# Patient Record
Sex: Female | Born: 1980 | Race: White | Hispanic: No | Marital: Married | Smoking: Former smoker
Health system: Southern US, Community
[De-identification: ages and names within clinical notes are randomized; demographics above are authoritative.]

## PROBLEM LIST (undated history)

## (undated) DIAGNOSIS — E785 Hyperlipidemia, unspecified: Secondary | ICD-10-CM

## (undated) DIAGNOSIS — M26629 Arthralgia of temporomandibular joint, unspecified side: Secondary | ICD-10-CM

## (undated) HISTORY — DX: Arthralgia of temporomandibular joint, unspecified side: M26.629

## (undated) HISTORY — PX: MANDIBLE SURGERY: SHX707

## (undated) HISTORY — PX: RHINOPLASTY: SUR1284

## (undated) HISTORY — DX: Hyperlipidemia, unspecified: E78.5

---

## 2002-10-10 ENCOUNTER — Ambulatory Visit: Admission: RE | Admit: 2002-10-10 | Discharge: 2002-10-10 | Payer: Self-pay | Admitting: *Deleted

## 2003-05-15 ENCOUNTER — Inpatient Hospital Stay (HOSPITAL_COMMUNITY): Admission: RE | Admit: 2003-05-15 | Discharge: 2003-05-17 | Payer: Self-pay | Admitting: Dentistry

## 2003-11-05 ENCOUNTER — Other Ambulatory Visit: Admission: RE | Admit: 2003-11-05 | Discharge: 2003-11-05 | Payer: Self-pay | Admitting: Obstetrics and Gynecology

## 2004-03-25 ENCOUNTER — Other Ambulatory Visit: Admission: RE | Admit: 2004-03-25 | Discharge: 2004-03-25 | Payer: Self-pay | Admitting: Obstetrics and Gynecology

## 2004-08-26 ENCOUNTER — Other Ambulatory Visit: Admission: RE | Admit: 2004-08-26 | Discharge: 2004-08-26 | Payer: Self-pay | Admitting: Obstetrics and Gynecology

## 2005-03-12 ENCOUNTER — Other Ambulatory Visit: Admission: RE | Admit: 2005-03-12 | Discharge: 2005-03-12 | Payer: Self-pay | Admitting: Obstetrics and Gynecology

## 2005-09-09 ENCOUNTER — Other Ambulatory Visit: Admission: RE | Admit: 2005-09-09 | Discharge: 2005-09-09 | Payer: Self-pay | Admitting: Obstetrics and Gynecology

## 2005-10-25 HISTORY — PX: GYNECOLOGIC CRYOSURGERY: SHX857

## 2007-08-07 ENCOUNTER — Inpatient Hospital Stay (HOSPITAL_COMMUNITY): Admission: AD | Admit: 2007-08-07 | Discharge: 2007-08-10 | Payer: Self-pay | Admitting: Obstetrics and Gynecology

## 2010-10-06 ENCOUNTER — Ambulatory Visit: Payer: Self-pay | Admitting: Obstetrics & Gynecology

## 2010-11-14 ENCOUNTER — Encounter: Payer: Self-pay | Admitting: Dentistry

## 2011-03-09 NOTE — Assessment & Plan Note (Signed)
NAME:  Shannon Moses, Shannon Moses NO.:  [id number]   MEDICAL RECORD NO.:  [id number]          PATIENT TYPE:  POB   LOCATION:  [facility] at [place]         FACILITY:  [facility]   PHYSICIAN:  Allie Bossier, MD        DATE OF BIRTH:  1981-09-07   DATE OF SERVICE:  10/06/2010                                  CLINIC NOTE   Shannon Moses is a 30 year old married white, G1, P1, she has a 3-year-  old daughter.  She has previously been a patient at [facility], specifically Dr. Vincente Poli.  However, this location is much more  convenient for her.  She has no particular GYN complaints today.  She  would like a monthly refill of her Tri-Sprintec.   PAST MEDICAL HISTORY:  History of TMJ.   PAST SURGICAL HISTORY:  She has had a rhinoplasty as a child and then  reconstructive surgery of her jaw.  She had cryosurgery of her cervix in  approximately 2007.   REVIEW OF SYSTEMS:  She has been married for last 6 years.  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  She has monthly periods that last 5 days or less and are very light.   FAMILY HISTORY:  Negative for breast, GYN and colon malignancies.  No  latex allergies.  No drug allergies.   SOCIAL HISTORY:  She reports social alcohol and she will smoke  occasionally on a Friday or Saturday night.  A pack of cigarettes will  last for about 3 weeks.   PHYSICAL EXAMINATION:  GENERAL:  Well-nourished, well-hydrated pleasant  white female, in no apparent distress.  VITAL SIGNS:  Height 5 feet 9 inches, weight 141 pounds, blood pressure  126/74, pulse 65.  HEENT:  Normal.  HEART:  Regular rate and rhythm.  LUNGS:  Clear to auscultation bilaterally.  BREASTS:  Normal bilaterally.  ABDOMEN:  Scaphoid.  No palpable hepatosplenomegaly.  EXTERNAL GENITALIA:  No lesions.  Cervix parous with no visible lesions.  Uterus normal size and shape, retroverted, nontender and mobile.  Adnexa  nontender.  No  masses.   ASSESSMENT AND PLAN:  1. Annual exam.  I have checked Pap smear.  Recommended self-breast      and self-vulvar exams monthly.  2. Birth control.  She is happy with birth control pills and does not      wish to have a different form of birth control.  I have refilled      her Tri-Sprintec with 13 refills.      Allie Bossier, MD     MCD/MEDQ  D:  10/06/2010  T:  10/07/2010  Job:  [id number]

## 2011-03-12 NOTE — Op Note (Signed)
NAME:  Shannon Moses, Shannon Moses                     ACCOUNT NO.:  [id number]   MEDICAL RECORD NO.:  [id number]                   PATIENT TYPE:  AMB   LOCATION:  DAY                                  FACILITY:  [facility]   PHYSICIAN:  Griffith Citron Mohorn, D.D.S.          DATE OF BIRTH:  Apr 16, 1981   DATE OF PROCEDURE:  05/15/2003  DATE OF DISCHARGE:                                 OPERATIVE REPORT   PREOPERATIVE DIAGNOSES:  1. Vertical maxillary excess.  2. Mandibular retrognathia.  3. Retrogenia.  4. Class II malocclusion.   POSTOPERATIVE DIAGNOSES:  1. Vertical maxillary excess.  2. Mandibular retrognathism.  3. Retrogenia.  4. Class II malocclusion.   OPERATION/PROCEDURE:  1. LeFort I maxillary osteotomy impaction.  2. Bilateral sagittal split ramus osteotomy advancement.  3. Genioplasty advancement.   SURGEON:  Griffith Citron Mohorn, D.D.S.   CO-SURGEON:  Lovena Le, D.D.S.   ANESTHESIA:  General anesthesia.   ESTIMATED BLOOD LOSS:  400 mL.   INDICATIONS FOR PROCEDURE:  The patient was evaluated clinically and  radiographically and was found to have a Class II malocclusion due to  vertical maxillary excess as well as mandibular deficiency.  Also following  evaluation, the patient was found to still have a deficiency following the  planned surgery; therefore, she elected to have advancement genioplasty as  well.  The risks and benefits were discussed extensively with the patient  preoperatively.   DESCRIPTION OF PROCEDURE:  The patient was brought to the operating room and  placed in the supine position.  Once general anesthesia was induced,  following nasal endotracheal intubation, the patient was prepped and draped  for a maxillofacial procedure of this type.   Initially proximally 3 mL of 0.5% Marcaine with 1:200,000 epinephrine was  injected into the anterior mandibular vestibule.  Once adequate time for  hemostasis to be achieved, the incision was made extending  from the left  first premolar, approximately 5 mm from the attached gingival tissue down to  the bone extending anteriorly only to the mucosa, approximately 1 cm from  the wet/dry line of the lip,and continuing around to the right first  mandibular premolar and extending down to the mucosa through the periosteum  and into the bone.  This mucosal flap was then carefully undermined with a  15 blade, continuing deep to the mentalis muscle, directing the incision  towards the anterior cortex of the mandible and continued down through the  periosteum to the bone with care being taken to avoid any branches of the  mental nerve bilaterally.  Full-thickness mucoperiosteal flaps were  reflected inferiorly and posteriorly, exposing the mental foramens  bilaterally.  The dissection continued to the inferior border below and  posterior to the mental foramens bilaterally.  The anterior portion of the  dissection continued to until approximately 5 mm of tissue was still  attached to the inferior border.  Midline and paramidline vertical lines  were placed with a  fissure bur with copious amounts of saline irrigation.   With the mental foramen protected on the right side, the reciprocating saw  was utilized to create a bicortical cut through the chin extending to a  point in the midline and continuing across the midline to a similar point  below and posterior to the mental foramen on the left side.  Once this cut  was completed bilaterally, mental nerves were noted to be intact.  The chin  was mobilized and advanced approximately 6 mm.  Care was taken to evenly  advance the chin using the vertical reference lines placed prior to  osteotomy.  The chin was then stabilized with a 6 mm advancement utilizing a  six-hole bone plate manufacturing by Leibinger and utilizing 8 mm screws.  Following placement of the bone plate, the chin was found to be sufficiently  stable and symmetrically advanced.  A  saline-soaked throat pack was then  placed for hemostasis.   Attention was then directed towards initiation of the mandibular osteotomy.  Approximately 3 mL of 0.5% Marcaine was injected in both the right and left  posterior mandibular regions.  Next, starting on the left side, incision was  made at the inferior portion of the ascending ramus through the mucosa down  through periosteum and extending anteriorly to an area lateral to left  mandibular vomer first molar.  Next, a subperiosteal dissection was  continued to expose the inferior border, a portion of a left lateral cortex,  and extending superiorly along the coronoid process.  A ramus stripper was  utilized to fully retract the soft tissue off the anterior border of the  ramus, and the tissue retained with a self-retaining clip.  Careful  dissection crossed the medial cortex and mandible in order to remain  subperiosteal and, therefore, protect the inferior alveolar nerve was  completed.  Dissection continued posteriorly and superiorly to the intra-  alveolar foramen and the medial soft tissue was protected with a Henahan  retractor above the foramen.  Foramen was identified with a nerve hook and  marked with a elevator on the anterior ramus.  A long Lindeman bur was  utilized to create a medial horizontal cut above the inferior alveolar  foramen, again protecting the soft tissue with the Henahan retractor.  Copious amounts of saline irrigation was utilized.  This medial cut  continued to approximately one-half the medial lateral thickness of the  anterior ramus, again extending posteriorly to the inferior alveolar  foramen.  Next, a 701 bur was utilized to continue osteotomy inferiorly  along the ascending ramus and then continued lateral to the area of the  second molar which was not present at this time.  Next, a 703 bur was  utilized to create a vertical cut extending to the inferior border superiorly and connecting the  initial osteotomy previously described.  All  cuts were made through the cortex.  However, no deeper.  No excessive  bleeding was noted.   Attention was then directed towards the side and a similar soft tissue  reflection and osteotomy was created no the right side.  Again no excessive  bleeding was noted during any portion of this procedure either.  Moist half-  packs were placed on both the right and left mandibular surgical sites.   Attention was then directed towards the maxillary osteotomy.  Approximately  4 mL of 0.5% Marcaine with 1:200,000 epinephrine was injected in the  maxillary vestibule.  Utilizing electrocautery bovie, the mucosa was incised  approximately 5 mm superior to the attached gingival tissue extending from  the first premolar on the right to the first premolar on the left maxilla.  This incision continued down through the muscle and periosteum.  A  periosteal elevator was utilized to create a subperiosteal dissection  superiorly, exposing the infraorbital bilaterally.  The piriform rims were  also exposed bilaterally.  Dissection continued posteriorly to expose the  lateral wall of the maxilla to the pterygomaxillary junction.  This was  completed successfully bilaterally without complications.  Vertical  reference folds were placed near the piriform rims and zygomatic buttress  bilaterally.  The nasal mucosa was then dissected away from the piriform  rims and the nasal mucosa was reflected away from the floor of the nasal  cavity, carefully bilaterally.  A reciprocating saw was utilized to create  the osteotomy starting in the left zygomatic buttress region and continuing  anteriorly to the piriform rims, care being taken to keep the osteotomy at  least 5 mm above the apex of the canine teeth bilaterally.  Osteotomy was  continued through the anterior portion of the lateral wall of the nasal  cavity.  Once this osteotomy was completed satisfactorily bilaterally,  the  osteotomize were then continued posteriorly utilizing the reciprocating saw  through was lateral wall of the maxilla.  Once this osteotomy was completed  satisfactorily bilaterally, attention was then directed towards the  osteotomy of the lateral nasal walls more posteriorly.  A single guarded  nasal osteotome was utilized to separate the lateral nasal walls posteriorly  approximately 2 cm bilaterally.  The pterygomaxillary osteotome was then  placed in the right pterygomaxillary junction to complete this osteotomy.  This was also utilized to separate the maxilla from the pterygoid plates on  the left side as well. Again, no excessive bleeding was noted at this time.  Next, a nasoseptal osteotome was then utilized to separate the base of the  septum from the floor of the nasal cavity.  Next, with inferiorly and  posteriorly directed force, the maxilla was then down-fractured without  complications.  The nasal mucosa was also carefully reflected as the down- fracture was completed.  No excessive bleeding was noted.  Bony interference  were removed.  The maxilla was completely immobilized for the planned  movement of the maxilla.  Careful removal of bone was also completed to  allow for a 6 mm impaction equally on the maxilla.  The intermediate splint  was then wired and the patient was placed into intermaxillary fixation  utilized 26-gauge wires to connect the maxillary and mandibular braces.  The  maxillomandibular complexes then rotated and no interferences were noted.  More bony removal was required to allow for the full 6 mm impaction in all  areas of the maxilla.  Once this was completed satisfactorily.  Bone plates  were then placed to stabilize the maxilla in its new position.  Utilizing  Leibinger five-hole, L-shaped, 1.7 mm plates, the plates were contoured to  fit passively adjacent to the piriform rims bilaterally as well as the  zygomatic buttress bilaterally.  The bone  plates were then stabilized  utilizing 4 mm bone screws in a locking fashion with the plates.  Once all  four plates were stabilized with five screws in each plate, maxilla was  evaluated and found to be exceptionally stable.  The patient was released  from intermaxillary fixation and the new position of the maxilla was  evaluated and found to be as planned  with excellent bony contact in all  areas.  Stability of the maxilla was also again evaluated and found to be  excellent.   Attention was then directed towards completing the mandibular osteotomies.  Starting on the left side, a small bone osteotome was utilized to initiate  widening of the osteotomy with care being taken not to drive the osteotome  too deep to violate the inferior alveolar nerve.  Sequentially enlarging the  osteotomes used, the osteotomy was gradually widened and separated.  Then as  the osteotomy was gradually widened, care was taken to identify the inferior  alveolar nerve.  Once it was identified, it was gradually and carefully  dissected from the proximal segment with a Woodson without complications.  The osteotomy was completed and the proximal and distal segments were  mobilized without complications.  Again the inferior alveolar nerve was  found to be intact with no damage noted.  Bony interferences were removed  carefully.  Site was irrigated with normal saline and a half pack was  placed.   Attention was then directed towards the right side.  Similarly osteotomes  were utilized to gradually widen the osteotomy started previously.  Once the  osteotomy was separated superficially to identify the inferior alveolar  nerve, it was again gradually dissected from a proximal segment until  completely out of the proximal segment.  The osteotomy was completed and the  proximal and distal segments were found to be completely mobile.  Care was  taken to remove any bony interferences of the proximal segment to allow  for passive approximation of the proximal and distal segments bilaterally.   Attention was eventually directed towards stabilization of the right side.  The patient was placed into the final splint and placed into maxillary  fixation.  At this point again the right proximal segment was guided into  the planned position against the distal segment and was found to fit  passively with good  bony contact.  With posterior and superior direction of  the proximal segment, it was stabilized with a modified Wolford clamp.  Next, utilizing a trocar, 10 mm bicortical 2.0 screws manufactured by  Leibinger were placed at the superior border, stabilizing the proximal  segment to the distal segment of the mandible.  The Wolford clamp was then  removed and the segments were found to be extremely stable with no mobility.   Next, attention was directed towards the left side.  Again, the proximal and  distal segments fit passively after removal of bony fremitus and again was  deep.  Again, with posterior gentle posterior and superior direction of the  proximal segment to fully seat the condyle, the Wolford clamp was utilized  to stabilize the segments.  Again, Leibinger 2 mm bone screws were placed  bicortically to stabilize the proximal segment to the distal segment.  At  this point the [facility] clamp was removed and again the segments were found  to be extremely stable.  Intermaxillary fixation was released and the  patient's occlusion was evaluated.  She was found to have excellent  occlusion with a midline within the mandible matching the midline of the  maxilla.  She also had the planned occlusive contact of posterior teeth.  At  this point it was decided that the splint would not be placed due to the  amount of tooth contact she has.  The oral cavity was then irrigated and  suctioned dry.  All surgical sites were also irrigated thoroughly and  suctioned  dry.   Attention with then directed to towards  closure of the surgical sites.  It  should be noted that prior to stabilized the maxilla, the nasal mucosa was  repaired with 4-0 chromic sutures.  The nasal suture was then placed and  stabilizing the paranasal muscular tissue to the anterior nasal spine  through a small hole placed in the anterior nasal spine.  This was sutured  with a 2-0 Vicryl suture.  Next, a V-Y closure of the upper lip was then  performed with a 4-0 Vicryl suture.  The remaining horizontal incision of  the maxilla surgical site was then closed utilizing a 4-0 Vicryl suture in a  running fashion.  Next, the right posterior mandibular surgical site was  then closed utilizing a 4-0 Vicryl suture.  A similar 4-0 Vicryl suture was  osteomized to close the left mandibular site, and 4-0 Vicryl sutures were  then utilized to close and reapproximate the mentalis muscle.  The anterior  surgical site and the mucosa was also closed a 4-0 Vicryl suture.  This  completed the procedure.  The patient's oral cavity was suctioned dry.  The oropharyngeal throat pack  which been placed preoperatively was removed.  The oropharynx was also  suctioned dry.  There was no excessive bleeding noted anywhere.  The patient  was then placed into heavy elastics to provide stability of the mandible and  comfort in the initial postoperative phase connecting the maxillary and the  mandibular braces.  This essentially concluded the procedure.  The patient  was allowed to awaken in the OR and transferred to the PACU in stable and  satisfactory condition.                                               Griffith Citron Mohorn, D.D.S.    Gardenia Phlegm  D:  05/15/2003  T:  05/15/2003  Job:  [id number]

## 2011-08-04 LAB — CBC
HCT: 26.3 — ABNORMAL LOW
Hemoglobin: 9.2 — ABNORMAL LOW
MCHC: 35
MCV: 96.3
Platelets: 196
RBC: 2.73 — ABNORMAL LOW
RDW: 14.3 — ABNORMAL HIGH
WBC: 15.6 — ABNORMAL HIGH

## 2011-08-05 LAB — RAPID HIV SCREEN (WH-MAU): Rapid HIV Screen: NONREACTIVE

## 2011-08-05 LAB — CBC
HCT: 33 — ABNORMAL LOW
Hemoglobin: 11.8 — ABNORMAL LOW
MCHC: 35.6
MCV: 94.8
Platelets: 210
RBC: 3.48 — ABNORMAL LOW
RDW: 13.9
WBC: 12.4 — ABNORMAL HIGH

## 2011-08-05 LAB — RPR: RPR Ser Ql: NONREACTIVE

## 2011-11-10 ENCOUNTER — Other Ambulatory Visit: Payer: Self-pay | Admitting: Obstetrics & Gynecology

## 2011-11-30 ENCOUNTER — Encounter: Payer: Self-pay | Admitting: Obstetrics & Gynecology

## 2011-11-30 ENCOUNTER — Ambulatory Visit (INDEPENDENT_AMBULATORY_CARE_PROVIDER_SITE_OTHER): Payer: 59 | Admitting: Obstetrics & Gynecology

## 2011-11-30 VITALS — BP 108/64 | HR 48 | Temp 97.9°F | Ht 69.0 in | Wt 149.1 lb

## 2011-11-30 DIAGNOSIS — Z0000 Encounter for general adult medical examination without abnormal findings: Secondary | ICD-10-CM

## 2011-11-30 DIAGNOSIS — Z113 Encounter for screening for infections with a predominantly sexual mode of transmission: Secondary | ICD-10-CM

## 2011-11-30 DIAGNOSIS — Z1272 Encounter for screening for malignant neoplasm of vagina: Secondary | ICD-10-CM

## 2011-11-30 MED ORDER — NORGESTIM-ETH ESTRAD TRIPHASIC 0.18/0.215/0.25 MG-35 MCG PO TABS: 1.0000 | ORAL_TABLET | Freq: Every day | ORAL | Status: DC

## 2011-11-30 NOTE — Progress Notes (Signed)
Subjective:    Shannon Moses is a 31 y.o. female who presents for an annual exam. The patient has no complaints today. She would like a refill on her Tri-sprintec. She thinks she and her husband are finished with childbearing.The patient is sexually active. GYN screening history: last pap: was normal. The patient wears seatbelts: yes. The patient participates in regular exercise: yes. Has the patient ever been transfused or tattooed?: no. The patient reports that there is not domestic violence in her life.   Menstrual History: OB History    Grav Para Term Preterm Abortions TAB SAB Ect Mult Living   1 1              Menarche age:  Patient's last menstrual period was 11/10/2011.    The following portions of the patient's history were reviewed and updated as appropriate: allergies, current medications, past family history, past medical history, past social history, past surgical history and problem list.  Review of Systems A comprehensive review of systems was negative. She has had her flu shot. Her daughter is 9 yo. She teaches pre school.   Objective:    BP 108/64  Pulse 48  Temp(Src) 97.9 F (36.6 C) (Oral)  Ht 5\' 9"  (1.753 m)  Wt 149 lb 1.3 oz (67.622 kg)  BMI 22.02 kg/m2  LMP 11/10/2011  General Appearance:    Alert, cooperative, no distress, appears stated age  Head:    Normocephalic, without obvious abnormality, atraumatic  Eyes:    PERRL, conjunctiva/corneas clear, EOM's intact, fundi    benign, both eyes  Ears:    Normal TM's and external ear canals, both ears  Nose:   Nares normal, septum midline, mucosa normal, no drainage    or sinus tenderness  Throat:   Lips, mucosa, and tongue normal; teeth and gums normal  Neck:   Supple, symmetrical, trachea midline, no adenopathy;    thyroid:  no enlargement/tenderness/nodules; no carotid   bruit or JVD  Back:     Symmetric, no curvature, ROM normal, no CVA tenderness  Lungs:     Clear to auscultation bilaterally,  respirations unlabored  Chest Wall:    No tenderness or deformity   Heart:    Regular rate and rhythm, S1 and S2 normal, no murmur, rub   or gallop  Breast Exam:    No tenderness, masses, or nipple abnormality  Abdomen:     Soft, non-tender, bowel sounds active all four quadrants,    no masses, no organomegaly  Genitalia:    Normal female without lesion, discharge or tenderness, NSSA, NT, no adnexal masses     Extremities:   Extremities normal, atraumatic, no cyanosis or edema  Pulses:   2+ and symmetric all extremities  Skin:   Skin color, texture, turgor normal, no rashes or lesions  Lymph nodes:   Cervical, supraclavicular, and axillary nodes normal  Neurologic:   CNII-XII intact, normal strength, sensation and reflexes    throughout  .    Assessment:    Healthy female exam.    Plan:     Pap smear.  She is not interested in LARC, wants a refill on OCPs.

## 2012-01-03 ENCOUNTER — Other Ambulatory Visit: Payer: Self-pay | Admitting: Obstetrics & Gynecology

## 2012-10-23 HISTORY — PX: BUNIONECTOMY: SHX129

## 2012-11-30 ENCOUNTER — Ambulatory Visit (INDEPENDENT_AMBULATORY_CARE_PROVIDER_SITE_OTHER): Payer: 59 | Admitting: Obstetrics & Gynecology

## 2012-11-30 ENCOUNTER — Encounter: Payer: Self-pay | Admitting: Obstetrics & Gynecology

## 2012-11-30 VITALS — BP 121/82 | HR 64 | Resp 16 | Ht 69.0 in | Wt 156.0 lb

## 2012-11-30 DIAGNOSIS — Z01419 Encounter for gynecological examination (general) (routine) without abnormal findings: Secondary | ICD-10-CM

## 2012-11-30 DIAGNOSIS — Z0000 Encounter for general adult medical examination without abnormal findings: Secondary | ICD-10-CM

## 2012-11-30 DIAGNOSIS — Z1151 Encounter for screening for human papillomavirus (HPV): Secondary | ICD-10-CM

## 2012-11-30 DIAGNOSIS — Z124 Encounter for screening for malignant neoplasm of cervix: Secondary | ICD-10-CM

## 2012-11-30 MED ORDER — NORGESTIM-ETH ESTRAD TRIPHASIC 0.18/0.215/0.25 MG-35 MCG PO TABS: 1.0000 | ORAL_TABLET | Freq: Every day | ORAL | Status: DC

## 2012-11-30 NOTE — Progress Notes (Signed)
Subjective:    Shannon Moses is a 32 y.o. female who presents for an annual exam. The patient has no complaints today.she would like a refill of her OCPs. The patient is sexually active. GYN screening history: last pap: was normal. The patient wears seatbelts: yes. The patient participates in regular exercise: yes. Has the patient ever been transfused or tattooed?: not asked. The patient reports that there is not domestic violence in her life.   Menstrual History: OB History    Grav Para Term Preterm Abortions TAB SAB Ect Mult Living   1 1              Menarche age: 70 Patient's last menstrual period was 11/07/2012.    The following portions of the patient's history were reviewed and updated as appropriate: allergies, current medications, past family history, past medical history, past social history, past surgical history and problem list.  Review of Systems A comprehensive review of systems was negative. She has been married for about 7 years, is a Manufacturing systems engineer. 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   Objective:    BP 121/82  Pulse 64  Resp 16  Ht 5\' 9"  (1.753 m)  Wt 156 lb (70.761 kg)  BMI 23.04 kg/m2  LMP 11/07/2012  General Appearance:    Alert, cooperative, no distress, appears stated age  Head:    Normocephalic, without obvious abnormality, atraumatic  Eyes:    PERRL, conjunctiva/corneas clear, EOM's intact, fundi    benign, both eyes  Ears:    Normal TM's and external ear canals, both ears  Nose:   Nares normal, septum midline, mucosa normal, no drainage    or sinus tenderness  Throat:   Lips, mucosa, and tongue normal; teeth and gums normal  Neck:   Supple, symmetrical, trachea midline, no adenopathy;    thyroid:  no enlargement/tenderness/nodules; no carotid   bruit or JVD  Back:     Symmetric, no curvature, ROM normal, no CVA tenderness  Lungs:     Clear to auscultation bilaterally, respirations unlabored  Chest Wall:    No tenderness or deformity   Heart:    Regular rate and rhythm, S1 and S2 normal, no murmur, rub   or gallop  Breast Exam:    No tenderness, masses, or nipple abnormality  Abdomen:     Soft, non-tender, bowel sounds active all four quadrants,    no masses, no organomegaly  Genitalia:    Normal female without lesion, discharge or tenderness, NSSA, NT, mobile, normal adnexal exam     Extremities:   Extremities normal, atraumatic, no cyanosis or edema  Pulses:   2+ and symmetric all extremities  Skin:   Skin color, texture, turgor normal, no rashes or lesions  Lymph nodes:   Cervical, supraclavicular, and axillary nodes normal  Neurologic:   CNII-XII intact, normal strength, sensation and reflexes    throughout  .    Assessment:    Healthy female exam.    Plan:     Thin prep Pap smear.  with HPV cotesting

## 2012-12-04 ENCOUNTER — Other Ambulatory Visit: Payer: Self-pay | Admitting: Obstetrics & Gynecology

## 2013-01-31 ENCOUNTER — Other Ambulatory Visit: Payer: Self-pay | Admitting: Obstetrics & Gynecology

## 2013-02-23 ENCOUNTER — Other Ambulatory Visit: Payer: Self-pay | Admitting: *Deleted

## 2013-02-23 DIAGNOSIS — IMO0001 Reserved for inherently not codable concepts without codable children: Secondary | ICD-10-CM

## 2013-02-23 MED ORDER — NORGESTIM-ETH ESTRAD TRIPHASIC 0.18/0.215/0.25 MG-35 MCG PO TABS: 1.0000 | ORAL_TABLET | Freq: Every day | ORAL | Status: DC

## 2013-02-23 NOTE — Telephone Encounter (Signed)
Pharmacy called verifying pt's RF's on OCP which should have been original RX with 11 RF's.

## 2013-12-05 ENCOUNTER — Ambulatory Visit: Payer: 59 | Admitting: Obstetrics & Gynecology

## 2013-12-12 ENCOUNTER — Ambulatory Visit: Payer: 59 | Admitting: Obstetrics & Gynecology

## 2013-12-18 ENCOUNTER — Ambulatory Visit: Payer: 59 | Admitting: Obstetrics & Gynecology

## 2013-12-18 ENCOUNTER — Telehealth: Payer: Self-pay | Admitting: *Deleted

## 2013-12-18 NOTE — Telephone Encounter (Signed)
Lm on voicemail that due to inclement weather the office will be closed today and pt needs to call office to reschedule.

## 2014-01-01 ENCOUNTER — Ambulatory Visit: Payer: 59 | Admitting: Obstetrics & Gynecology

## 2014-01-10 ENCOUNTER — Encounter: Payer: Self-pay | Admitting: Obstetrics & Gynecology

## 2014-01-10 ENCOUNTER — Ambulatory Visit (INDEPENDENT_AMBULATORY_CARE_PROVIDER_SITE_OTHER): Payer: 59 | Admitting: Obstetrics & Gynecology

## 2014-01-10 VITALS — BP 138/83 | HR 62 | Resp 16 | Ht 69.0 in | Wt 147.0 lb

## 2014-01-10 DIAGNOSIS — Z124 Encounter for screening for malignant neoplasm of cervix: Secondary | ICD-10-CM

## 2014-01-10 DIAGNOSIS — Z1151 Encounter for screening for human papillomavirus (HPV): Secondary | ICD-10-CM

## 2014-01-10 DIAGNOSIS — Z3041 Encounter for surveillance of contraceptive pills: Secondary | ICD-10-CM

## 2014-01-10 DIAGNOSIS — Z01419 Encounter for gynecological examination (general) (routine) without abnormal findings: Secondary | ICD-10-CM

## 2014-01-10 DIAGNOSIS — IMO0001 Reserved for inherently not codable concepts without codable children: Secondary | ICD-10-CM

## 2014-01-10 DIAGNOSIS — Z0000 Encounter for general adult medical examination without abnormal findings: Secondary | ICD-10-CM

## 2014-01-10 MED ORDER — NORGESTIM-ETH ESTRAD TRIPHASIC 0.18/0.215/0.25 MG-35 MCG PO TABS: 1.0000 | ORAL_TABLET | Freq: Every day | ORAL | Status: DC

## 2014-01-10 NOTE — Progress Notes (Signed)
Subjective:    Shannon Moses is a 33 y.o. female who presents for an annual exam. The patient has no complaints today.She wants a refill of her OCPs.  The patient is sexually active. GYN screening history: last pap: was normal. The patient wears seatbelts: yes. The patient participates in regular exercise: yes. Has the patient ever been transfused or tattooed?: no. The patient reports that there is not domestic violence in her life.   Menstrual History: OB History   Grav Para Term Preterm Abortions TAB SAB Ect Mult Living   1 1              Menarche age: [age]  Patient's last menstrual period was 01/01/2014.    The following portions of the patient's history were reviewed and updated as appropriate: allergies, current medications, past family history, past medical history, past social history, past surgical history and problem list.  Review of Systems A comprehensive review of systems was negative. Married for 9 1/2 years, denies dyspareunia. She had a flu vaccine this season. Teaches preschool.   Objective:    BP 138/83  Pulse 62  Resp 16  Ht 5\' 9"  (1.753 m)  Wt 147 lb (66.679 kg)  BMI 21.70 kg/m2  LMP 01/01/2014  General Appearance:    Alert, cooperative, no distress, appears stated age  Head:    Normocephalic, without obvious abnormality, atraumatic  Eyes:    PERRL, conjunctiva/corneas clear, EOM's intact, fundi    benign, both eyes  Ears:    Normal TM's and external ear canals, both ears  Nose:   Nares normal, septum midline, mucosa normal, no drainage    or sinus tenderness  Throat:   Lips, mucosa, and tongue normal; teeth and gums normal  Neck:   Supple, symmetrical, trachea midline, no adenopathy;    thyroid:  no enlargement/tenderness/nodules; no carotid   bruit or JVD  Back:     Symmetric, no curvature, ROM normal, no CVA tenderness  Lungs:     Clear to auscultation bilaterally, respirations unlabored  Chest Wall:    No tenderness or deformity   Heart:    Regular  rate and rhythm, S1 and S2 normal, no murmur, rub   or gallop  Breast Exam:    No tenderness, masses, or nipple abnormality  Abdomen:     Soft, non-tender, bowel sounds active all four quadrants,    no masses, no organomegaly  Genitalia:    Normal female without lesion, discharge or tenderness, NSSR, NT, mobile, normal adnexal exam     Extremities:   Extremities normal, atraumatic, no cyanosis or edema  Pulses:   2+ and symmetric all extremities  Skin:   Skin color, texture, turgor normal, no rashes or lesions  Lymph nodes:   Cervical, supraclavicular, and axillary nodes normal  Neurologic:   CNII-XII intact, normal strength, sensation and reflexes    throughout  .    Assessment:    Healthy female exam.    Plan:     Breast self exam technique reviewed and patient encouraged to perform self-exam monthly. Thin prep Pap smear.  I have refilled her OCPs

## 2014-01-23 ENCOUNTER — Other Ambulatory Visit: Payer: Self-pay | Admitting: Obstetrics & Gynecology

## 2014-07-25 ENCOUNTER — Other Ambulatory Visit (INDEPENDENT_AMBULATORY_CARE_PROVIDER_SITE_OTHER): Payer: 59 | Admitting: *Deleted

## 2014-07-25 DIAGNOSIS — N898 Other specified noninflammatory disorders of vagina: Secondary | ICD-10-CM

## 2014-07-25 DIAGNOSIS — N9489 Other specified conditions associated with female genital organs and menstrual cycle: Secondary | ICD-10-CM

## 2014-07-25 MED ORDER — METRONIDAZOLE 500 MG PO TABS: 500.0000 mg | ORAL_TABLET | Freq: Two times a day (BID) | ORAL | Status: DC

## 2014-07-25 NOTE — Progress Notes (Signed)
Pt has no vaginal discharge, itching, burning. The only Sx she has is the odor. Pt is going out of town on vacation. I explained the risks of alcohol use while taking Flagyl. Pt expressed understanding and will follow up when she returns from vacation.

## 2014-07-25 NOTE — Progress Notes (Signed)
Pt here c/o vaginal odor that started last night. Pt showered but odor returned.  Discharge Itching Burning

## 2014-07-26 ENCOUNTER — Telehealth: Payer: Self-pay | Admitting: *Deleted

## 2014-07-26 LAB — WET PREP BY MOLECULAR PROBE
CANDIDA SPECIES: NEGATIVE
GARDNERELLA VAGINALIS: POSITIVE — AB
TRICHOMONAS VAG: NEGATIVE

## 2014-07-26 NOTE — Telephone Encounter (Signed)
Called pt to adv positive BV and needs to take medication that was called into pharmacy yesterday. LMOM for pt to rtn call if needed.

## 2014-08-05 ENCOUNTER — Telehealth: Payer: Self-pay | Admitting: *Deleted

## 2014-08-05 NOTE — Telephone Encounter (Signed)
Pt called in stating she started Flagyl for BV. After 4 days on Flagyl pt stated she "felt abd cramping and felt something come out of vagina and a tampon came out while using the bathroom". Pt states the vaginal odor went away and no longer had cramps. She finished Flagyl and has no adverse symptoms. I explained to pt that as long as she finished Flagyl and the tampon is out she should be good but if she starts to have Sx again then to come in for exam. Pt expressed understanding.

## 2014-08-26 ENCOUNTER — Encounter: Payer: Self-pay | Admitting: Obstetrics & Gynecology

## 2015-03-17 ENCOUNTER — Ambulatory Visit (INDEPENDENT_AMBULATORY_CARE_PROVIDER_SITE_OTHER): Payer: Managed Care, Other (non HMO) | Admitting: Obstetrics & Gynecology

## 2015-03-17 ENCOUNTER — Encounter: Payer: Self-pay | Admitting: Obstetrics & Gynecology

## 2015-03-17 VITALS — BP 115/73 | HR 68 | Resp 16 | Ht 69.0 in | Wt 148.0 lb

## 2015-03-17 DIAGNOSIS — Z01419 Encounter for gynecological examination (general) (routine) without abnormal findings: Secondary | ICD-10-CM

## 2015-03-17 DIAGNOSIS — Z124 Encounter for screening for malignant neoplasm of cervix: Secondary | ICD-10-CM | POA: Diagnosis not present

## 2015-03-17 DIAGNOSIS — Z1151 Encounter for screening for human papillomavirus (HPV): Secondary | ICD-10-CM | POA: Diagnosis not present

## 2015-03-17 DIAGNOSIS — Z0000 Encounter for general adult medical examination without abnormal findings: Secondary | ICD-10-CM

## 2015-03-17 MED ORDER — NORGESTIM-ETH ESTRAD TRIPHASIC 0.18/0.215/0.25 MG-35 MCG PO TABS: 1.0000 | ORAL_TABLET | Freq: Every day | ORAL | Status: DC

## 2015-03-17 NOTE — Progress Notes (Signed)
Subjective:    Shannon ReddenJennifer D Moses is a 34 y.o. MW 48P1 (737 yo daughter) female who presents for an annual exam. The patient has no complaints today. The patient is sexually active. GYN screening history: last pap: was normal. The patient wears seatbelts: yes. The patient participates in regular exercise: yes. Has the patient ever been transfused or tattooed?: no. The patient reports that there is not domestic violence in her life.   Menstrual History: OB History    Gravida Para Term Preterm AB TAB SAB Ectopic Multiple Living   1 1              Menarche age: 34  Patient's last menstrual period was 02/24/2015.    The following portions of the patient's history were reviewed and updated as appropriate: allergies, current medications, past family history, past medical history, past social history, past surgical history and problem list.  Review of Systems Pertinent items are noted in HPI.  Married for 10 years, denies dyspareunia. Preschool teacher, now Interior and spatial designerdirector. Happy with the pill but wants husband to get vasectomy.  Objective:    BP 115/73 mmHg  Pulse 68  Resp 16  Ht 5\' 9"  (1.753 m)  Wt 148 lb (67.132 kg)  BMI 21.85 kg/m2  LMP 02/24/2015  General Appearance:    Alert, cooperative, no distress, appears stated age  Head:    Normocephalic, without obvious abnormality, atraumatic  Eyes:    PERRL, conjunctiva/corneas clear, EOM's intact, fundi    benign, both eyes  Ears:    Normal TM's and external ear canals, both ears  Nose:   Nares normal, septum midline, mucosa normal, no drainage    or sinus tenderness  Throat:   Lips, mucosa, and tongue normal; teeth and gums normal  Neck:   Supple, symmetrical, trachea midline, no adenopathy;    thyroid:  no enlargement/tenderness/nodules; no carotid   bruit or JVD  Back:     Symmetric, no curvature, ROM normal, no CVA tenderness  Lungs:     Clear to auscultation bilaterally, respirations unlabored  Chest Wall:    No tenderness or deformity   Heart:    Regular rate and rhythm, S1 and S2 normal, no murmur, rub   or gallop  Breast Exam:    No tenderness, masses, or nipple abnormality  Abdomen:     Soft, non-tender, bowel sounds active all four quadrants,    no masses, no organomegaly  Genitalia:    Normal female without lesion, discharge or tenderness, NSSA, NT, mobile, normal adnexal exam     Extremities:   Extremities normal, atraumatic, no cyanosis or edema  Pulses:   2+ and symmetric all extremities  Skin:   Skin color, texture, turgor normal, no rashes or lesions  Lymph nodes:   Cervical, supraclavicular, and axillary nodes normal  Neurologic:   CNII-XII intact, normal strength, sensation and reflexes    throughout  .    Assessment:    Healthy female exam.    Plan:     Breast self exam technique reviewed and patient encouraged to perform self-exam monthly. Thin prep Pap smear. with cotesting

## 2015-03-19 LAB — CYTOLOGY - PAP

## 2015-03-20 ENCOUNTER — Other Ambulatory Visit: Payer: Self-pay | Admitting: Obstetrics & Gynecology

## 2015-03-28 ENCOUNTER — Other Ambulatory Visit: Payer: Managed Care, Other (non HMO)

## 2015-03-28 LAB — COMPREHENSIVE METABOLIC PANEL
ALBUMIN: 4.1 g/dL (ref 3.5–5.2)
ALT: 14 U/L (ref 0–35)
AST: 14 U/L (ref 0–37)
Alkaline Phosphatase: 36 U/L — ABNORMAL LOW (ref 39–117)
BUN: 14 mg/dL (ref 6–23)
CALCIUM: 9.1 mg/dL (ref 8.4–10.5)
CHLORIDE: 106 meq/L (ref 96–112)
CO2: 27 meq/L (ref 19–32)
Creat: 0.67 mg/dL (ref 0.50–1.10)
Glucose, Bld: 82 mg/dL (ref 70–99)
POTASSIUM: 4.8 meq/L (ref 3.5–5.3)
Sodium: 140 mEq/L (ref 135–145)
Total Bilirubin: 0.5 mg/dL (ref 0.2–1.2)
Total Protein: 6.7 g/dL (ref 6.0–8.3)

## 2015-03-28 LAB — LIPID PANEL
CHOLESTEROL: 203 mg/dL — AB (ref 0–200)
HDL: 81 mg/dL (ref >=46)
LDL Cholesterol: 103 mg/dL — ABNORMAL HIGH (ref 0–99)
Total CHOL/HDL Ratio: 2.5 Ratio
Triglycerides: 97 mg/dL (ref <150)
VLDL: 19 mg/dL (ref 0–40)

## 2015-03-28 LAB — CBC
HCT: 38.9 % (ref 36.0–46.0)
Hemoglobin: 13.1 g/dL (ref 12.0–15.0)
MCH: 32.2 pg (ref 26.0–34.0)
MCHC: 33.7 g/dL (ref 30.0–36.0)
MCV: 95.6 fL (ref 78.0–100.0)
MPV: 9.5 fL (ref 8.6–12.4)
Platelets: 279 10*3/uL (ref 150–400)
RBC: 4.07 MIL/uL (ref 3.87–5.11)
RDW: 12.7 % (ref 11.5–15.5)
WBC: 7.6 10*3/uL (ref 4.0–10.5)

## 2015-03-28 LAB — TSH: TSH: 1.315 u[IU]/mL (ref 0.350–4.500)

## 2015-04-01 LAB — VITAMIN D 1,25 DIHYDROXY
Vitamin D 1, 25 (OH)2 Total: 60 pg/mL (ref 18–72)
Vitamin D2 1, 25 (OH)2: <8 pg/mL
Vitamin D3 1, 25 (OH)2: 60 pg/mL

## 2016-03-30 ENCOUNTER — Encounter: Payer: Self-pay | Admitting: Obstetrics & Gynecology

## 2016-03-30 ENCOUNTER — Ambulatory Visit (INDEPENDENT_AMBULATORY_CARE_PROVIDER_SITE_OTHER): Payer: Managed Care, Other (non HMO) | Admitting: Obstetrics & Gynecology

## 2016-03-30 VITALS — BP 117/74 | HR 53 | Resp 16 | Ht 69.0 in | Wt 143.0 lb

## 2016-03-30 DIAGNOSIS — Z01419 Encounter for gynecological examination (general) (routine) without abnormal findings: Secondary | ICD-10-CM | POA: Diagnosis not present

## 2016-03-30 DIAGNOSIS — Z124 Encounter for screening for malignant neoplasm of cervix: Secondary | ICD-10-CM | POA: Diagnosis not present

## 2016-03-30 DIAGNOSIS — Z1151 Encounter for screening for human papillomavirus (HPV): Secondary | ICD-10-CM

## 2016-03-30 MED ORDER — NORGESTIM-ETH ESTRAD TRIPHASIC 0.18/0.215/0.25 MG-35 MCG PO TABS: 1.0000 | ORAL_TABLET | Freq: Every day | ORAL | Status: DC

## 2016-03-30 NOTE — Progress Notes (Signed)
Subjective:    Shannon ReddenJennifer D Moses is a 35 y.o. MW 41P1 (36 yo daughter) female who presents for an annual exam. The patient has no complaints today. She is happy with her OCPs, needs a refill. Hoping her husband will get a vasectomy.  The patient is sexually active. GYN screening history: last pap: was normal. The patient wears seatbelts: yes. The patient participates in regular exercise: yes. Has the patient ever been transfused or tattooed?: no. The patient reports that there is not domestic violence in her life.   Menstrual History: OB History    Gravida Para Term Preterm AB TAB SAB Ectopic Multiple Living   1 1              Menarche age: [age]  Patient's last menstrual period was 03/23/2016.    The following portions of the patient's history were reviewed and updated as appropriate: allergies, current medications, past family history, past medical history, past social history, past surgical history and problem list.  Review of Systems Pertinent items are noted in HPI. 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   Objective:    BP 117/74 mmHg  Pulse 53  Resp 16  Ht 5\' 9"  (1.753 m)  Wt 143 lb (64.864 kg)  BMI 21.11 kg/m2  LMP 03/23/2016  General Appearance:    Alert, cooperative, no distress, appears stated age  Head:    Normocephalic, without obvious abnormality, atraumatic  Eyes:    PERRL, conjunctiva/corneas clear, EOM's intact, fundi    benign, both eyes  Ears:    Normal TM's and external ear canals, both ears  Nose:   Nares normal, septum midline, mucosa normal, no drainage    or sinus tenderness  Throat:   Lips, mucosa, and tongue normal; teeth and gums normal  Neck:   Supple, symmetrical, trachea midline, no adenopathy;    thyroid:  no enlargement/tenderness/nodules; no carotid   bruit or JVD  Back:     Symmetric, no curvature, ROM normal, no CVA tenderness  Lungs:     Clear to auscultation bilaterally, respirations unlabored  Chest Wall:    No  tenderness or deformity   Heart:    Regular rate and rhythm, S1 and S2 normal, no murmur, rub   or gallop  Breast Exam:    No tenderness, masses, or nipple abnormality  Abdomen:     Soft, non-tender, bowel sounds active all four quadrants,    no masses, no organomegaly  Genitalia:    Normal female without lesion, discharge or tenderness, NSSR, NT, mobile, normal adnexal exam     Extremities:   Extremities normal, atraumatic, no cyanosis or edema  Pulses:   2+ and symmetric all extremities  Skin:   Skin color, texture, turgor normal, no rashes or lesions  Lymph nodes:   Cervical, supraclavicular, and axillary nodes normal  Neurologic:   CNII-XII intact, normal strength, sensation and reflexes    throughout  .    Assessment:    Healthy female exam.    Plan:     Thin prep Pap smear.  with cotesting (she is aware of ACOG recs)

## 2016-04-01 LAB — CYTOLOGY - PAP

## 2017-04-19 ENCOUNTER — Other Ambulatory Visit: Payer: Self-pay | Admitting: Obstetrics & Gynecology

## 2017-04-19 DIAGNOSIS — Z3041 Encounter for surveillance of contraceptive pills: Secondary | ICD-10-CM

## 2017-04-21 NOTE — Telephone Encounter (Signed)
Refill sent to pharmacy.   

## 2017-07-06 ENCOUNTER — Encounter: Payer: Self-pay | Admitting: Obstetrics & Gynecology

## 2017-07-06 ENCOUNTER — Ambulatory Visit (INDEPENDENT_AMBULATORY_CARE_PROVIDER_SITE_OTHER): Payer: 59 | Admitting: Obstetrics & Gynecology

## 2017-07-06 VITALS — BP 117/74 | HR 69 | Ht 69.0 in | Wt 144.0 lb

## 2017-07-06 DIAGNOSIS — Z113 Encounter for screening for infections with a predominantly sexual mode of transmission: Secondary | ICD-10-CM | POA: Diagnosis not present

## 2017-07-06 DIAGNOSIS — Z202 Contact with and (suspected) exposure to infections with a predominantly sexual mode of transmission: Secondary | ICD-10-CM | POA: Diagnosis not present

## 2017-07-06 DIAGNOSIS — Z3041 Encounter for surveillance of contraceptive pills: Secondary | ICD-10-CM

## 2017-07-06 DIAGNOSIS — Z01419 Encounter for gynecological examination (general) (routine) without abnormal findings: Secondary | ICD-10-CM | POA: Diagnosis not present

## 2017-07-06 MED ORDER — NORGESTIM-ETH ESTRAD TRIPHASIC 0.18/0.215/0.25 MG-35 MCG PO TABS: 1.0000 | ORAL_TABLET | Freq: Every day | ORAL | 3 refills | Status: DC

## 2017-07-06 NOTE — Progress Notes (Signed)
Subjective:     Shannon ReddenJennifer D Moses is a 36 y.o. female here for a routine exam.  Current complaints: separated from husband; had one boyfriend and unprotected intercourse.  Desires STD testing today.  Taking birth control with no issue.   Gynecologic History Patient's last menstrual period was 07/06/2017. Contraception: OCP (estrogen/progesterone) Last Pap: 2017. Results were: normal   Obstetric History OB History  Gravida Para Term Preterm AB Living  1 1       1   SAB TAB Ectopic Multiple Live Births               # Outcome Date GA Lbr Len/2nd Weight Sex Delivery Anes PTL Lv  1 Para                The following portions of the patient's history were reviewed and updated as appropriate: allergies, current medications, past family history, past medical history, past social history, past surgical history and problem list.  Review of Systems Pertinent items noted in HPI and remainder of comprehensive ROS otherwise negative.    Objective:      Vitals:   07/06/17 0940  BP: 117/74  Pulse: 69  Weight: 144 lb (65.3 kg)  Height: 5\' 9"  (1.753 m)   Vitals:  WNL General appearance: alert, cooperative and no distress  HEENT: Normocephalic, without obvious abnormality, atraumatic Eyes: negative Throat: lips, mucosa, and tongue normal; teeth and gums normal  Respiratory: Clear to auscultation bilaterally  CV: Regular rate and rhythm  Breasts:  Normal appearance, no masses or tenderness, no nipple retraction or dimpling  GI: Soft, non-tender; bowel sounds normal; no masses,  no organomegaly  GU: External Genitalia:  Tanner V, no lesion Urethra:  No prolapse   Vagina: Pink, normal rugae, no blood or discharge  Cervix: No CMT, no lesion  Uterus:  Normal size and contour, non tender  Adnexa: Normal, no masses, non tender  Musculoskeletal: No edema, redness or tenderness in the calves or thighs  Skin: No lesions or rash  Lymphatic: Axillary adenopathy: none     Psychiatric:  Normal mood and behavior        Assessment:    Healthy female exam.    Plan:   STD testing.  Pap not due for at least for 2 more years.  Educated patient about HPV and cervical cancer.   Continue OCPs.

## 2017-07-07 LAB — HEPATITIS B SURFACE ANTIGEN: Hepatitis B Surface Ag: NONREACTIVE

## 2017-07-07 LAB — CERVICOVAGINAL ANCILLARY ONLY
Chlamydia: NEGATIVE
NEISSERIA GONORRHEA: NEGATIVE
Trichomonas: NEGATIVE

## 2017-07-07 LAB — HIV ANTIBODY (ROUTINE TESTING W REFLEX): HIV: NONREACTIVE

## 2017-07-07 LAB — HEPATITIS C ANTIBODY
Hepatitis C Ab: NONREACTIVE
SIGNAL TO CUT-OFF: 0.04 (ref <1.00)

## 2017-07-07 LAB — RPR: RPR Ser Ql: NONREACTIVE

## 2017-07-11 ENCOUNTER — Telehealth: Payer: Self-pay | Admitting: *Deleted

## 2017-07-11 NOTE — Telephone Encounter (Signed)
-----   Message from Lesly Dukes, MD sent at 07/10/2017  6:59 AM EDT ----- Cultures are negative.  RN/CMA to call

## 2017-07-11 NOTE — Telephone Encounter (Signed)
Pt notified of neg labs,cultures and pap smear.

## 2017-11-09 ENCOUNTER — Other Ambulatory Visit (INDEPENDENT_AMBULATORY_CARE_PROVIDER_SITE_OTHER): Payer: 59

## 2017-11-09 DIAGNOSIS — R309 Painful micturition, unspecified: Secondary | ICD-10-CM | POA: Diagnosis not present

## 2017-11-09 LAB — POCT URINALYSIS DIPSTICK
BILIRUBIN UA: NEGATIVE
Glucose, UA: NEGATIVE
Ketones, UA: NEGATIVE
NITRITE UA: POSITIVE
PH UA: 7.5 (ref 5.0–8.0)
SPEC GRAV UA: 1.015 (ref 1.010–1.025)
UROBILINOGEN UA: NEGATIVE U/dL — AB

## 2017-11-09 MED ORDER — PHENAZOPYRIDINE HCL 200 MG PO TABS: 200.0000 mg | ORAL_TABLET | Freq: Three times a day (TID) | ORAL | 0 refills | Status: DC | PRN

## 2017-11-09 MED ORDER — SULFAMETHOXAZOLE-TRIMETHOPRIM 800-160 MG PO TABS: 1.0000 | ORAL_TABLET | Freq: Two times a day (BID) | ORAL | 0 refills | Status: DC

## 2017-11-09 NOTE — Progress Notes (Signed)
Pt here with c/o's urinary burning and pain with pressure.  Urinalysis shows Nitrites, Leukocytes, blood .  Culture sent and per protocol pt started on Bactrim DS and Pyridium.  This was sent to [facility].

## 2017-11-12 LAB — CULTURE, URINE COMPREHENSIVE
MICRO NUMBER: 90066251
SPECIMEN QUALITY:: ADEQUATE

## 2018-01-09 ENCOUNTER — Other Ambulatory Visit: Payer: Self-pay | Admitting: *Deleted

## 2018-01-09 DIAGNOSIS — Z3041 Encounter for surveillance of contraceptive pills: Secondary | ICD-10-CM

## 2018-01-09 MED ORDER — NORGESTIM-ETH ESTRAD TRIPHASIC 0.18/0.215/0.25 MG-35 MCG PO TABS: 1.0000 | ORAL_TABLET | Freq: Every day | ORAL | 3 refills | Status: DC

## 2018-01-13 ENCOUNTER — Encounter: Payer: Self-pay | Admitting: Obstetrics & Gynecology

## 2018-01-13 ENCOUNTER — Other Ambulatory Visit (INDEPENDENT_AMBULATORY_CARE_PROVIDER_SITE_OTHER): Payer: 59

## 2018-01-13 VITALS — Resp 16 | Ht 69.0 in | Wt 144.0 lb

## 2018-01-13 DIAGNOSIS — R319 Hematuria, unspecified: Secondary | ICD-10-CM | POA: Diagnosis not present

## 2018-01-13 DIAGNOSIS — N390 Urinary tract infection, site not specified: Secondary | ICD-10-CM | POA: Diagnosis not present

## 2018-01-13 LAB — POCT URINALYSIS DIPSTICK
BILIRUBIN UA: NEGATIVE
Glucose, UA: NEGATIVE
KETONES UA: NEGATIVE
Nitrite, UA: POSITIVE
PH UA: 6 (ref 5.0–8.0)
Protein, UA: NEGATIVE
Spec Grav, UA: 1.015 (ref 1.010–1.025)
UROBILINOGEN UA: NEGATIVE U/dL — AB

## 2018-01-13 MED ORDER — SULFAMETHOXAZOLE-TRIMETHOPRIM 400-80 MG PO TABS: 1.0000 | ORAL_TABLET | Freq: Two times a day (BID) | ORAL | 1 refills | Status: DC

## 2018-01-13 NOTE — Progress Notes (Signed)
Pt here with c/o's of urinary frequency and pain that woke her up @ 4 AM this morning.  She took a Pyridium and has gotten some relief.  Urine dip shows blood nitrites and leuks.  Urine culture sent to lab and RX for Bactrim DS sent to her pharmacy per protocol

## 2018-01-15 LAB — CULTURE, URINE COMPREHENSIVE
MICRO NUMBER: 90362919
SPECIMEN QUALITY:: ADEQUATE

## 2018-01-16 ENCOUNTER — Telehealth: Payer: Self-pay | Admitting: *Deleted

## 2018-01-16 MED ORDER — NITROFURANTOIN MONOHYD MACRO 100 MG PO CAPS: 100.0000 mg | ORAL_CAPSULE | Freq: Two times a day (BID) | ORAL | 0 refills | Status: DC

## 2018-01-16 MED ORDER — FLUCONAZOLE 150 MG PO TABS: 150.0000 mg | ORAL_TABLET | Freq: Once | ORAL | 1 refills | Status: AC

## 2018-01-16 NOTE — Telephone Encounter (Signed)
Pt notified that she needs Macrobid for her UTI.  Pt also request Diflucan since she is susceptible to yeast when taking ATX.  [facility] Marice Potterove.

## 2018-01-16 NOTE — Telephone Encounter (Signed)
-----   Message from Allie BossierMyra C Dove, MD sent at 01/16/2018  8:14 AM EDT ----- She needs macrobid for a UTI. Thanks

## 2018-02-08 ENCOUNTER — Ambulatory Visit (INDEPENDENT_AMBULATORY_CARE_PROVIDER_SITE_OTHER): Payer: 59 | Admitting: Obstetrics & Gynecology

## 2018-02-08 ENCOUNTER — Ambulatory Visit (INDEPENDENT_AMBULATORY_CARE_PROVIDER_SITE_OTHER): Payer: 59

## 2018-02-08 ENCOUNTER — Encounter: Payer: Self-pay | Admitting: Obstetrics & Gynecology

## 2018-02-08 ENCOUNTER — Other Ambulatory Visit: Payer: 59

## 2018-02-08 VITALS — BP 130/77 | HR 69 | Ht 69.0 in | Wt 147.0 lb

## 2018-02-08 DIAGNOSIS — R319 Hematuria, unspecified: Secondary | ICD-10-CM

## 2018-02-08 DIAGNOSIS — Z113 Encounter for screening for infections with a predominantly sexual mode of transmission: Secondary | ICD-10-CM

## 2018-02-08 DIAGNOSIS — Z3202 Encounter for pregnancy test, result negative: Secondary | ICD-10-CM | POA: Diagnosis not present

## 2018-02-08 DIAGNOSIS — N83202 Unspecified ovarian cyst, left side: Secondary | ICD-10-CM | POA: Diagnosis not present

## 2018-02-08 DIAGNOSIS — R102 Pelvic and perineal pain: Secondary | ICD-10-CM

## 2018-02-08 DIAGNOSIS — Z202 Contact with and (suspected) exposure to infections with a predominantly sexual mode of transmission: Secondary | ICD-10-CM

## 2018-02-08 DIAGNOSIS — Z01419 Encounter for gynecological examination (general) (routine) without abnormal findings: Secondary | ICD-10-CM | POA: Diagnosis not present

## 2018-02-08 LAB — POCT URINE PREGNANCY: Preg Test, Ur: NEGATIVE

## 2018-02-08 MED ORDER — IOPAMIDOL (ISOVUE-300) INJECTION 61%
100.0000 mL | Freq: Once | INTRAVENOUS | Status: AC | PRN
  Administered 2018-02-08: 100 mL via INTRAVENOUS

## 2018-02-08 MED ORDER — MEGESTROL ACETATE 40 MG PO TABS: ORAL_TABLET | ORAL | 0 refills | Status: DC

## 2018-02-08 NOTE — Progress Notes (Signed)
Pt c/o pelvic pain 

## 2018-02-08 NOTE — Addendum Note (Signed)
Addended by: Granville LewisLARK, Emlyn Maves L on: 02/08/2018 03:35 PM   Modules accepted: Orders

## 2018-02-08 NOTE — Progress Notes (Signed)
Subjective:     Shannon Moses is a 37 y.o. female here for a routine exam.  Current complaints: left sided pain for several weeks and recurrent UTIs (+culture in epic).  Pt in new relationship and talking about getting married and having a child.     Gynecologic History Patient's last menstrual period was 01/24/2018. Contraception: OCP (estrogen/progesterone) Last Pap: 2017. Results were: normal Last mammogram: n/a.   Obstetric History OB History  Gravida Para Term Preterm AB Living  1 1       1   SAB TAB Ectopic Multiple Live Births               # Outcome Date GA Lbr Len/2nd Weight Sex Delivery Anes PTL Lv  1 Para              The following portions of the patient's history were reviewed and updated as appropriate: allergies, current medications, past family history, past medical history, past social history, past surgical history and problem list.  Review of Systems Pertinent items noted in HPI and remainder of comprehensive ROS otherwise negative.    Objective:      Vitals:   02/08/18 1022  BP: 130/77  Pulse: 69  Weight: 147 lb (66.7 kg)  Height: 5\' 9"  (1.753 m)   Vitals:  WNL General appearance: alert, cooperative and no distress  HEENT: Normocephalic, without obvious abnormality, atraumatic Eyes: negative Throat: lips, mucosa, and tongue normal; teeth and gums normal  Respiratory: Clear to auscultation bilaterally  CV: Regular rate and rhythm  Breasts:  Normal appearance, no masses or tenderness, no nipple retraction or dimpling  GI: Soft, non-tender; bowel sounds normal; no masses,  no organomegaly  GU: External Genitalia:  Tanner V, no lesion Urethra:  No prolapse   Vagina: Pink, normal rugae, no blood or discharge  Cervix: No CMT, no lesion  Uterus:  Normal size and contour, non tender  Adnexa: Normal, no masses, mild pain in right adnexa  Musculoskeletal: No edema, redness or tenderness in the calves or thighs  Skin: No lesions or rash  Lymphatic:  Axillary adenopathy: none     Psychiatric: Normal mood and behavior        Assessment:    Healthy female exam.    Plan:   Pap due 2020 Clean catch UTI + culture Will get CT abd/pelvis with contrast to r/u GU abnormality for recurrent UTI STD panel Cont OCPs

## 2018-02-09 ENCOUNTER — Other Ambulatory Visit: Payer: Self-pay | Admitting: *Deleted

## 2018-02-09 DIAGNOSIS — N83209 Unspecified ovarian cyst, unspecified side: Secondary | ICD-10-CM

## 2018-02-09 DIAGNOSIS — N390 Urinary tract infection, site not specified: Secondary | ICD-10-CM

## 2018-02-09 LAB — COMPREHENSIVE METABOLIC PANEL
AG RATIO: 1.9 (calc) (ref 1.0–2.5)
ALKALINE PHOSPHATASE (APISO): 44 U/L (ref 33–115)
ALT: 11 U/L (ref 6–29)
AST: 14 U/L (ref 10–30)
Albumin: 4.5 g/dL (ref 3.6–5.1)
BUN: 9 mg/dL (ref 7–25)
CHLORIDE: 102 mmol/L (ref 98–110)
CO2: 28 mmol/L (ref 20–32)
Calcium: 9.5 mg/dL (ref 8.6–10.2)
Creat: 0.78 mg/dL (ref 0.50–1.10)
Globulin: 2.4 g/dL (calc) (ref 1.9–3.7)
Glucose, Bld: 85 mg/dL (ref 65–99)
Potassium: 4.3 mmol/L (ref 3.5–5.3)
Sodium: 137 mmol/L (ref 135–146)
Total Bilirubin: 0.5 mg/dL (ref 0.2–1.2)
Total Protein: 6.9 g/dL (ref 6.1–8.1)

## 2018-02-09 LAB — HEPATITIS C ANTIBODY
Hepatitis C Ab: NONREACTIVE
SIGNAL TO CUT-OFF: 0.04 (ref <1.00)

## 2018-02-09 LAB — RPR: RPR: NONREACTIVE

## 2018-02-09 LAB — CERVICOVAGINAL ANCILLARY ONLY
CHLAMYDIA, DNA PROBE: NEGATIVE
Neisseria Gonorrhea: NEGATIVE

## 2018-02-09 LAB — HEPATITIS B SURFACE ANTIGEN: Hepatitis B Surface Ag: NONREACTIVE

## 2018-02-09 LAB — HIV ANTIBODY (ROUTINE TESTING W REFLEX): HIV: NONREACTIVE

## 2018-02-09 NOTE — Progress Notes (Signed)
Pt aware of her CT results.  Order placed for F/U ovarian cyst in 6 weeks per Dr Penne LashLeggett.

## 2018-02-09 NOTE — Addendum Note (Signed)
Addended by: Kathie DikeSOLA, Essa Wenk J on: 02/09/2018 01:36 PM   Modules accepted: Orders

## 2018-03-21 ENCOUNTER — Ambulatory Visit (INDEPENDENT_AMBULATORY_CARE_PROVIDER_SITE_OTHER): Payer: 59

## 2018-03-21 DIAGNOSIS — N83202 Unspecified ovarian cyst, left side: Secondary | ICD-10-CM

## 2018-03-21 DIAGNOSIS — N83209 Unspecified ovarian cyst, unspecified side: Secondary | ICD-10-CM

## 2018-03-22 ENCOUNTER — Telehealth: Payer: Self-pay | Admitting: *Deleted

## 2018-03-22 NOTE — Telephone Encounter (Signed)
Pt notified of normal TVU.  Pt stated that she had seen the results on My-Chart last night.

## 2018-03-22 NOTE — Telephone Encounter (Signed)
-----   Message from Lesly Dukes, MD sent at 03/21/2018  9:45 PM EDT ----- Normal ultrasound.  No ovarian cysts.

## 2018-11-06 ENCOUNTER — Ambulatory Visit (INDEPENDENT_AMBULATORY_CARE_PROVIDER_SITE_OTHER): Payer: 59 | Admitting: Obstetrics & Gynecology

## 2018-11-06 ENCOUNTER — Encounter: Payer: Self-pay | Admitting: Obstetrics & Gynecology

## 2018-11-06 ENCOUNTER — Ambulatory Visit: Payer: 59 | Admitting: Obstetrics & Gynecology

## 2018-11-06 VITALS — BP 114/78 | HR 48 | Resp 16 | Ht 69.0 in | Wt 146.0 lb

## 2018-11-06 DIAGNOSIS — N898 Other specified noninflammatory disorders of vagina: Secondary | ICD-10-CM

## 2018-11-06 DIAGNOSIS — B9689 Other specified bacterial agents as the cause of diseases classified elsewhere: Secondary | ICD-10-CM | POA: Diagnosis not present

## 2018-11-06 DIAGNOSIS — N760 Acute vaginitis: Secondary | ICD-10-CM

## 2018-11-06 NOTE — Progress Notes (Signed)
   Subjective:    Patient ID: Shannon Moses, female    DOB: August 16, 1981, 38 y.o.   MRN: [id number]  HPI 38 yo married lady here with the concern of a bump in the vaginal introitus that she noticed recently.  She recently was treated with diflucan for a yeast infection. She stopped taking OCPs and is taking PNVs, wanting a pregnancy.   Review of Systems     Objective:   Physical Exam Breathing, conversing, and ambulating normally Well nourished, well hydrated White female, no apparent distress Vulva-shaved She points to a bit of a hymenal remnant when asked what concerns her. - reassurance given Vaginal discharge with speculum exam is prominent, white with yellowish tinge     Assessment & Plan:  Vaginal discharge- wet prep sent

## 2018-11-06 NOTE — Progress Notes (Signed)
Pt c/o vaginal irritation, odor and a lesion

## 2018-11-07 LAB — CERVICOVAGINAL ANCILLARY ONLY
Bacterial vaginitis: POSITIVE — AB
CANDIDA VAGINITIS: NEGATIVE

## 2018-11-09 DIAGNOSIS — N760 Acute vaginitis: Principal | ICD-10-CM

## 2018-11-09 DIAGNOSIS — B9689 Other specified bacterial agents as the cause of diseases classified elsewhere: Secondary | ICD-10-CM

## 2018-11-09 MED ORDER — METRONIDAZOLE 500 MG PO TABS: 500.0000 mg | ORAL_TABLET | Freq: Two times a day (BID) | ORAL | 0 refills | Status: DC

## 2018-11-09 NOTE — Telephone Encounter (Signed)
Sent message a pt on MyChart letting her know of positive BV results and Flagyl has been sent to her pharmacy.

## 2019-01-19 ENCOUNTER — Telehealth: Payer: Self-pay | Admitting: *Deleted

## 2019-01-19 MED ORDER — FLUCONAZOLE 150 MG PO TABS: 150.0000 mg | ORAL_TABLET | Freq: Once | ORAL | 0 refills | Status: AC

## 2019-01-19 MED ORDER — SULFAMETHOXAZOLE-TRIMETHOPRIM 800-160 MG PO TABS: 1.0000 | ORAL_TABLET | Freq: Two times a day (BID) | ORAL | 0 refills | Status: DC

## 2019-01-19 NOTE — Telephone Encounter (Signed)
Pt called stating that she was having pain and urgency when she goes to bathroom.  It started this AM.  She does have a h/o recurrent UTI's.  Due to the Co-Vid 19 stay in order she was RX Bactrim DS and [facility]  She will call if her SX do not improve.

## 2019-04-30 ENCOUNTER — Other Ambulatory Visit: Payer: Self-pay

## 2019-04-30 ENCOUNTER — Encounter: Payer: Self-pay | Admitting: *Deleted

## 2019-04-30 ENCOUNTER — Other Ambulatory Visit (INDEPENDENT_AMBULATORY_CARE_PROVIDER_SITE_OTHER): Payer: 59 | Admitting: *Deleted

## 2019-04-30 VITALS — Temp 97.8°F | Resp 16

## 2019-04-30 DIAGNOSIS — R309 Painful micturition, unspecified: Secondary | ICD-10-CM | POA: Diagnosis not present

## 2019-04-30 DIAGNOSIS — N898 Other specified noninflammatory disorders of vagina: Secondary | ICD-10-CM | POA: Diagnosis not present

## 2019-04-30 DIAGNOSIS — B9689 Other specified bacterial agents as the cause of diseases classified elsewhere: Secondary | ICD-10-CM | POA: Diagnosis not present

## 2019-04-30 DIAGNOSIS — N760 Acute vaginitis: Secondary | ICD-10-CM

## 2019-04-30 LAB — POCT URINALYSIS DIPSTICK
Appearance: NORMAL
Bilirubin, UA: NEGATIVE
Glucose, UA: NEGATIVE
Ketones, UA: NEGATIVE
Nitrite, UA: NEGATIVE
Protein, UA: NEGATIVE
Spec Grav, UA: <=1.005 — AB (ref 1.010–1.025)
Urobilinogen, UA: NEGATIVE E.U./dL — AB
pH, UA: 8.5 — AB (ref 5.0–8.0)

## 2019-04-30 MED ORDER — PHENAZOPYRIDINE HCL 200 MG PO TABS: 200.0000 mg | ORAL_TABLET | Freq: Three times a day (TID) | ORAL | 0 refills | Status: DC | PRN

## 2019-04-30 NOTE — Progress Notes (Signed)
SUBJECTIVE: Shannon Moses is a 38 y.o. female who complains of urinary frequency, urgency and dysuria x 1 days, without flank pain, fever, chills, or abnormal vaginal discharge.  She woke up this morning around 1:00 with urinary spasms and was feeling very uncomfortable.  She states that she had to go multiple times but only dribbled.  She does have a history of UTI's.    OBJECTIVE: Appears well, in no apparent distress.  Vital signs are normal. Urine dipstick shows  Large leukocytes but no nitrites.  She does have large blood but she did start her period today also.  I also had her to do a swab swab for BV and yeast just to r/o.  She has been spending a lot of time at the pool this summer. ASSESSMENT: Dysuria  PLAN: Treatment per orders.  Will send in RX for Pyridium and pt is aware that the urine culture will take 48 hours.   Call or return to clinic prn if these symptoms worsen or fail to improve as anticipated.

## 2019-05-01 ENCOUNTER — Telehealth: Payer: Self-pay | Admitting: *Deleted

## 2019-05-01 LAB — CERVICOVAGINAL ANCILLARY ONLY
Bacterial vaginitis: POSITIVE — AB
Candida vaginitis: NEGATIVE

## 2019-05-01 MED ORDER — METRONIDAZOLE 500 MG PO TABS: 500.0000 mg | ORAL_TABLET | Freq: Two times a day (BID) | ORAL | 0 refills | Status: DC

## 2019-05-01 NOTE — Telephone Encounter (Signed)
LM on voicemail that her culture was positive for BV and [facility].  Will notify as urine culture is resulted.

## 2019-05-02 ENCOUNTER — Other Ambulatory Visit: Payer: Self-pay | Admitting: Obstetrics & Gynecology

## 2019-05-02 LAB — CULTURE, URINE COMPREHENSIVE
MICRO NUMBER:: 637610
SPECIMEN QUALITY:: ADEQUATE

## 2019-05-02 MED ORDER — NITROFURANTOIN MONOHYD MACRO 100 MG PO CAPS: 100.0000 mg | ORAL_CAPSULE | Freq: Two times a day (BID) | ORAL | 0 refills | Status: DC

## 2019-05-02 NOTE — Progress Notes (Signed)
U culture return Staph Saprophyticus.  Rx with marcobid.  Message sent to RN staff.

## 2020-02-06 ENCOUNTER — Ambulatory Visit (INDEPENDENT_AMBULATORY_CARE_PROVIDER_SITE_OTHER): Payer: 59

## 2020-02-06 ENCOUNTER — Other Ambulatory Visit: Payer: Self-pay

## 2020-02-06 DIAGNOSIS — R300 Dysuria: Secondary | ICD-10-CM | POA: Diagnosis not present

## 2020-02-06 LAB — POCT URINALYSIS DIPSTICK
Bilirubin, UA: NEGATIVE
Glucose, UA: NEGATIVE
Ketones, UA: NEGATIVE
Leukocytes, UA: NEGATIVE
Nitrite, UA: NEGATIVE
Protein, UA: NEGATIVE
Spec Grav, UA: 1.01 (ref 1.010–1.025)
Urobilinogen, UA: 0.2 E.U./dL
pH, UA: 5 (ref 5.0–8.0)

## 2020-02-06 NOTE — Progress Notes (Signed)
Pt c/o UTI symptoms. Pt denies yeast or BV symptoms. Pt declines Aptima swab. Urine culture sent. Pt states her symptoms have improved today but, she just wants to make sure. Dipstick normal.

## 2020-02-08 LAB — URINE CULTURE
MICRO NUMBER:: 10362567
SPECIMEN QUALITY:: ADEQUATE

## 2020-02-10 ENCOUNTER — Telehealth: Payer: Self-pay | Admitting: Obstetrics and Gynecology

## 2020-02-10 NOTE — Telephone Encounter (Signed)
Discussed urinary results with Victorino Dike. + urine culture She had called the on-call RN yesterday d/t discomfort. She was given Rx for bactrim. She has taken 3 doses so far and is feeling much better. Reports 3-4 UTI's this year alone. Recommend she call the office Monday for f/u and would consider referral to urology.  Patient agreeable to plan of care.   Duane Lope, NP 02/10/2020 12:40 PM

## 2020-02-18 ENCOUNTER — Other Ambulatory Visit: Payer: Self-pay

## 2020-02-18 ENCOUNTER — Other Ambulatory Visit (HOSPITAL_COMMUNITY)
Admission: RE | Admit: 2020-02-18 | Discharge: 2020-02-18 | Disposition: A | Discharge status: Home / Self Care | Payer: 59 | Source: Ambulatory Visit | Attending: Obstetrics & Gynecology | Admitting: Obstetrics & Gynecology

## 2020-02-18 ENCOUNTER — Ambulatory Visit (INDEPENDENT_AMBULATORY_CARE_PROVIDER_SITE_OTHER): Payer: 59 | Admitting: Obstetrics & Gynecology

## 2020-02-18 ENCOUNTER — Encounter: Payer: Self-pay | Admitting: Obstetrics & Gynecology

## 2020-02-18 VITALS — BP 127/87 | HR 64 | Ht 69.0 in | Wt 155.0 lb

## 2020-02-18 DIAGNOSIS — Z1272 Encounter for screening for malignant neoplasm of vagina: Secondary | ICD-10-CM

## 2020-02-18 DIAGNOSIS — R102 Pelvic and perineal pain: Secondary | ICD-10-CM | POA: Diagnosis not present

## 2020-02-18 DIAGNOSIS — Z01419 Encounter for gynecological examination (general) (routine) without abnormal findings: Secondary | ICD-10-CM | POA: Diagnosis present

## 2020-02-18 DIAGNOSIS — Z3202 Encounter for pregnancy test, result negative: Secondary | ICD-10-CM | POA: Diagnosis not present

## 2020-02-18 DIAGNOSIS — N83201 Unspecified ovarian cyst, right side: Secondary | ICD-10-CM

## 2020-02-18 LAB — POCT URINE PREGNANCY: Preg Test, Ur: NEGATIVE

## 2020-02-18 NOTE — Progress Notes (Signed)
Pt c/o pain after intercourse

## 2020-02-19 ENCOUNTER — Ambulatory Visit (INDEPENDENT_AMBULATORY_CARE_PROVIDER_SITE_OTHER): Payer: 59

## 2020-02-19 ENCOUNTER — Other Ambulatory Visit: Payer: Self-pay | Admitting: Obstetrics & Gynecology

## 2020-02-19 ENCOUNTER — Ambulatory Visit: Payer: 59

## 2020-02-19 DIAGNOSIS — R102 Pelvic and perineal pain: Secondary | ICD-10-CM | POA: Diagnosis not present

## 2020-02-19 LAB — CYTOLOGY - PAP
Adequacy: ABSENT
Comment: NEGATIVE
Diagnosis: NEGATIVE
High risk HPV: NEGATIVE

## 2020-02-20 LAB — URINE CULTURE
MICRO NUMBER:: 10405908
Result:: NO GROWTH
SPECIMEN QUALITY:: ADEQUATE

## 2020-02-21 NOTE — Progress Notes (Signed)
   Subjective:    Patient ID: Shannon Moses, female    DOB: [birth year], 39 y.o.   MRN: [id number]   [name] is a 39 y.o. female here for a routine exam.  Current complaints: severe right lower quadrant pain after intercourse 5 days ago.  Pt had natural childbirth and this pain was worse.  She used a heating pad and OTC analgesics to manage.  Pain not present now and see is very worried it will return.  Pt also was recently treated for e coli ut and needs TOC.   Gynecologic History Patient's last menstrual period was 01/27/2020. Contraception: abstinence and rhythm method Last Pap: 2017. Results were: normal Last mammogram: none.   Obstetric History OB History  Gravida Para Term Preterm AB Living  1 1       1   SAB TAB Ectopic Multiple Live Births               # Outcome Date GA Lbr Len/2nd Weight Sex Delivery Anes PTL Lv  1 Para              The following portions of the patient's history were reviewed and updated as appropriate: allergies, current medications, past family history, past medical history, past social history, past surgical history and problem list.  Review of Systems Pertinent items noted in HPI and remainder of comprehensive ROS otherwise negative.    Objective:      Vitals:   02/18/20 1333  BP: 127/87  Pulse: 64  Weight: 155 lb (70.3 kg)  Height: 5\' 9"  (1.753 m)   Vitals:  WNL General appearance: alert, cooperative and no distress  HEENT: Normocephalic, without obvious abnormality, atraumatic Eyes: negative Throat: lips, mucosa, and tongue normal; teeth and gums normal  Respiratory: Clear to auscultation bilaterally  CV: Regular rate and rhythm  Breasts:  Normal appearance, no masses or tenderness, no nipple retraction or dimpling  GI: Soft, non-tender; bowel sounds normal; no masses,  no organomegaly  GU: External Genitalia:  Tanner V, no lesion Urethra:  No prolapse   Vagina: Pink, normal rugae, no blood or discharge  Cervix: No CMT,  no lesion  Uterus:  Normal size and contour, non tender  Adnexa: Normal, no masses, mild tenderness with deep palpation of right ovary  Musculoskeletal: No edema, redness or tenderness in the calves or thighs  Skin: No lesions or rash  Lymphatic: Axillary adenopathy: none     Psychiatric: Normal mood and behavior       Assessment:    Healthy female exam.    Plan:   1.  Pap with co-tesing 2.  TVUS to evaluate adnexa 3.  TOC from recent UTI.

## 2020-03-27 ENCOUNTER — Other Ambulatory Visit: Payer: 59

## 2020-04-02 ENCOUNTER — Ambulatory Visit (INDEPENDENT_AMBULATORY_CARE_PROVIDER_SITE_OTHER): Payer: 59

## 2020-04-02 ENCOUNTER — Other Ambulatory Visit: Payer: Self-pay

## 2020-04-02 DIAGNOSIS — N83201 Unspecified ovarian cyst, right side: Secondary | ICD-10-CM

## 2020-04-07 ENCOUNTER — Telehealth: Payer: Self-pay | Admitting: *Deleted

## 2020-04-07 NOTE — Telephone Encounter (Cosign Needed)
Pt called stating that for the past 2 months her cycles have been somewhat irregular coming earlier than expected.  She and her husband have decided that she will go back on OCP's.  She started her period on Sat and has some Tri-Previfem she had from before that are still in date and wants to go ahead and start them on Sunday.

## 2020-06-24 ENCOUNTER — Other Ambulatory Visit: Payer: Self-pay | Admitting: *Deleted

## 2020-06-24 MED ORDER — NORGESTIM-ETH ESTRAD TRIPHASIC 0.18/0.215/0.25 MG-35 MCG PO TABS: 1.0000 | ORAL_TABLET | Freq: Every day | ORAL | 8 refills | Status: DC

## 2020-06-24 NOTE — Telephone Encounter (Signed)
Pt called requesting a RF on Tri-Previfem be sent to [facility].  Last annual 4/21.  8 RF's given

## 2020-12-02 ENCOUNTER — Telehealth: Payer: 59 | Admitting: Physician Assistant

## 2020-12-02 DIAGNOSIS — U071 COVID-19: Secondary | ICD-10-CM

## 2020-12-02 MED ORDER — AZITHROMYCIN 250 MG PO TABS: ORAL_TABLET | ORAL | 0 refills | Status: DC

## 2020-12-02 MED ORDER — BENZONATATE 100 MG PO CAPS: 100.0000 mg | ORAL_CAPSULE | Freq: Three times a day (TID) | ORAL | 0 refills | Status: DC | PRN

## 2020-12-02 MED ORDER — ALBUTEROL SULFATE HFA 108 (90 BASE) MCG/ACT IN AERS: 2.0000 | INHALATION_SPRAY | Freq: Four times a day (QID) | RESPIRATORY_TRACT | 0 refills | Status: DC | PRN

## 2020-12-02 NOTE — Progress Notes (Signed)
Message sent to patient for clarification regarding COVID test date and result. Awaiting patient response.

## 2020-12-02 NOTE — Progress Notes (Signed)
E-Visit for Corona Virus Screening  We are sorry you are not feeling well. We are here to help!  You have tested positive for COVID-19, meaning that you were infected with the novel coronavirus and could give the virus to others.  It is vitally important that you stay home so you do not spread it to others.      Please continue isolation at home, for at least 10 days since the start of your symptoms and until you have had 24 hours with no fever (without taking a fever reducer) and with improving of symptoms.  If you have no symptoms but tested positive (or all symptoms resolve after 5 days and you have no fever) you can leave your house but continue to wear a mask around others for an additional 5 days. If you have a fever,continue to stay home until you have had 24 hours of no fever. Most cases improve 5-10 days from onset but we have seen a small number of patients who have gotten worse after the 10 days.  Please be sure to watch for worsening symptoms and remain taking the proper precautions.   Go to the nearest hospital ED for assessment if fever/cough/breathlessness are severe or illness seems like a threat to life.    The following symptoms may appear 2-14 days after exposure: . Fever . Cough . Shortness of breath or difficulty breathing . Chills . Repeated shaking with chills . Muscle pain . Headache . Sore throat . New loss of taste or smell . Fatigue . Congestion or runny nose . Nausea or vomiting . Diarrhea  You have been enrolled in [facility] Monitoring for COVID-19. Daily you will receive a questionnaire within the MyChart website. Our COVID-19 response team will be monitoring your responses daily.  You can use medication such as A prescription cough medication called Tessalon Perles 100 mg. You may take 1-2 capsules every 8 hours as needed for cough and A prescription inhaler called Albuterol MDI 90 mcg /actuation 2 puffs every 4 hours as needed for shortness of breath,  wheezing, cough. I am also concerned about a potential secondary bacterial infection giving some of the symptoms you are having. I have sent in a script for Azithromycin to take as directed.   We have asked the monoclonal antibody team contact you to discuss the infusion with you and potentially set this up. You should hear from them in the next 48 hours.    You may also take acetaminophen (Tylenol) as needed for fever.  HOME CARE: . Only take medications as instructed by your medical team. . Drink plenty of fluids and get plenty of rest. . A steam or ultrasonic humidifier can help if you have congestion.   GET HELP RIGHT AWAY IF YOU HAVE EMERGENCY WARNING SIGNS.  Call 911 or proceed to your closest emergency facility if: . You develop worsening high fever. . Trouble breathing . Bluish lips or face . Persistent pain or pressure in the chest . New confusion . Inability to wake or stay awake . You cough up blood. . Your symptoms become more severe . Inability to hold down food or fluids  This list is not all possible symptoms. Contact your medical provider for any symptoms that are severe or concerning to you.    Your e-visit answers were reviewed by a board certified advanced clinical practitioner to complete your personal care plan.  Depending on the condition, your plan could have included both over the counter or  prescription medications.  If there is a problem please reply once you have received a response from your provider.  Your safety is important to [place].  If you have drug allergies check your prescription carefully.    You can use MyChart to ask questions about today's visit, request a non-urgent call back, or ask for a work or school excuse for 24 hours related to this e-Visit. If it has been greater than 24 hours you will need to follow up with your provider, or enter a new e-Visit to address those concerns. You will get an e-mail in the next two days asking about your  experience.  I hope that your e-visit has been valuable and will speed your recovery. Thank you for using e-visits.

## 2020-12-02 NOTE — Progress Notes (Signed)
I have spent 5 minutes in review of e-visit questionnaire, review and updating patient chart, medical decision making and response to patient.   Amere Iott Cody Schae Cando, PA-C    

## 2021-02-23 ENCOUNTER — Ambulatory Visit: Payer: 59 | Admitting: Obstetrics & Gynecology

## 2021-02-25 ENCOUNTER — Ambulatory Visit (INDEPENDENT_AMBULATORY_CARE_PROVIDER_SITE_OTHER): Payer: 59 | Admitting: Obstetrics & Gynecology

## 2021-02-25 ENCOUNTER — Other Ambulatory Visit: Payer: Self-pay

## 2021-02-25 ENCOUNTER — Encounter: Payer: Self-pay | Admitting: Obstetrics & Gynecology

## 2021-02-25 VITALS — BP 117/79 | HR 53 | Ht 69.0 in | Wt 150.0 lb

## 2021-02-25 DIAGNOSIS — Z789 Other specified health status: Secondary | ICD-10-CM | POA: Diagnosis not present

## 2021-02-25 DIAGNOSIS — Z8742 Personal history of other diseases of the female genital tract: Secondary | ICD-10-CM | POA: Diagnosis not present

## 2021-02-25 DIAGNOSIS — Z01419 Encounter for gynecological examination (general) (routine) without abnormal findings: Secondary | ICD-10-CM

## 2021-02-25 MED ORDER — NORGESTIM-ETH ESTRAD TRIPHASIC 0.18/0.215/0.25 MG-35 MCG PO TABS: 1.0000 | ORAL_TABLET | Freq: Every day | ORAL | 11 refills | Status: DC

## 2021-02-25 NOTE — Progress Notes (Signed)
Subjective:     Shannon Moses is a 40 y.o. female here for a routine exam.  Current complaints: none.  She and her husband are complete with their family.  They are not going to pursued pregnancy.  Discussed a beagle puppy and are very happy with her life.   Gynecologic History Patient's last menstrual period was 02/09/2021. Contraception: OCP (estrogen/progesterone) Last Pap: 2021. Results were: normal Last mammogram: none.   Obstetric History OB History  Gravida Para Term Preterm AB Living  1 1       1   SAB IAB Ectopic Multiple Live Births               # Outcome Date GA Lbr Len/2nd Weight Sex Delivery Anes PTL Lv  1 Para              The following portions of the patient's history were reviewed and updated as appropriate: allergies, current medications, past family history, past medical history, past social history, past surgical history and problem list.  Review of Systems Pertinent items noted in HPI and remainder of comprehensive ROS otherwise negative.    Objective:      Vitals:   02/25/21 1058  BP: 117/79  Pulse: (!) 53  Weight: 150 lb (68 kg)  Height: 5\' 9"  (1.753 m)   Vitals:  WNL General appearance: alert, cooperative and no distress  HEENT: Normocephalic, without obvious abnormality, atraumatic Eyes: negative Throat: lips, mucosa, and tongue normal; teeth and gums normal  Respiratory: Clear to auscultation bilaterally  CV: Regular rate and rhythm  Breasts:  Normal appearance, no masses or tenderness, no nipple retraction or dimpling  GI: Soft, non-tender; bowel sounds normal; no masses,  no organomegaly  GU: External Genitalia:  Tanner V, no lesion Urethra:  No prolapse   Vagina: Pink, normal rugae, no blood or discharge  Cervix: No CMT, no lesion  Uterus:  Normal size and contour, non tender  Adnexa: Normal, no masses, non tender  Musculoskeletal: No edema, redness or tenderness in the calves or thighs  Skin: No lesions or rash  Lymphatic:  Axillary adenopathy: none     Psychiatric: Normal mood and behavior    Assessment:    Healthy female exam.    Plan:   1.  Mammogram ordered. 2.  Pap smear not due until 2024 earliest 3.  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

## 2021-03-11 ENCOUNTER — Ambulatory Visit (INDEPENDENT_AMBULATORY_CARE_PROVIDER_SITE_OTHER): Payer: 59

## 2021-03-11 ENCOUNTER — Other Ambulatory Visit: Payer: Self-pay

## 2021-03-11 DIAGNOSIS — Z1231 Encounter for screening mammogram for malignant neoplasm of breast: Secondary | ICD-10-CM

## 2021-03-11 DIAGNOSIS — Z01419 Encounter for gynecological examination (general) (routine) without abnormal findings: Secondary | ICD-10-CM

## 2021-12-07 ENCOUNTER — Other Ambulatory Visit: Payer: Self-pay

## 2021-12-07 ENCOUNTER — Ambulatory Visit (INDEPENDENT_AMBULATORY_CARE_PROVIDER_SITE_OTHER): Payer: BC Managed Care – PPO | Admitting: Obstetrics & Gynecology

## 2021-12-07 ENCOUNTER — Encounter: Payer: Self-pay | Admitting: Obstetrics & Gynecology

## 2021-12-07 VITALS — BP 116/85 | HR 66 | Wt 156.0 lb

## 2021-12-07 DIAGNOSIS — R300 Dysuria: Secondary | ICD-10-CM | POA: Diagnosis not present

## 2021-12-07 LAB — POCT URINALYSIS DIPSTICK
Bilirubin, UA: NEGATIVE
Glucose, UA: NEGATIVE
Ketones, UA: NEGATIVE
Leukocytes, UA: NEGATIVE
Nitrite, UA: NEGATIVE
Protein, UA: NEGATIVE
Spec Grav, UA: 1.015 (ref 1.010–1.025)
Urobilinogen, UA: 0.2 E.U./dL
pH, UA: 7 (ref 5.0–8.0)

## 2021-12-07 MED ORDER — NITROFURANTOIN MONOHYD MACRO 100 MG PO CAPS: ORAL_CAPSULE | ORAL | 0 refills | Status: DC

## 2021-12-07 NOTE — Progress Notes (Signed)
° °  Subjective:    Patient ID: Shannon Moses, female    DOB: 1981-04-23, 41 y.o.   MRN: [id number]  HPI  41 year old female presents for symptoms of urinary tract infection.  Patient has a history of several urinary tract infections in her life.  They have been better over recently.  She is having pressure and dysuria but no hematuria.  She is not due for her menses in about a week.  Patient wipes front to back.  She had E. coli in the past that has been sensitive to [place].  She does notice that her urinary tract infections are associated with intercourse.  Patient urinates after intercourse but not before.  Review of Systems  Constitutional: Negative.   Respiratory: Negative.    Cardiovascular: Negative.   Gastrointestinal: Negative.   Genitourinary:  Positive for dysuria and frequency. Negative for flank pain and vaginal bleeding.      Objective:   Physical Exam Vitals reviewed.  Constitutional:      General: She is not in acute distress.    Appearance: She is well-developed.  HENT:     Head: Normocephalic and atraumatic.  Eyes:     Conjunctiva/sclera: Conjunctivae normal.  Cardiovascular:     Rate and Rhythm: Normal rate.  Pulmonary:     Effort: Pulmonary effort is normal.  Skin:    General: Skin is warm and dry.  Neurological:     Mental Status: She is alert and oriented to person, place, and time.  Psychiatric:        Mood and Affect: Mood normal.   Vitals:   12/07/21 0827 12/07/21 0830  BP: (!) 142/90 116/85  Pulse: 78 66  Weight: 156 lb (70.8 kg)        Assessment & Plan:  41 year old female with symptoms of urinary tract infection Dip UA shows trace hemoglobin.  Will send for culture and microscopy. We will treat empirically with Macrobid twice a day for 5 days.  Patient was given of Macrobid to take after intercourse.  She will also urinate before and after intercourse.  Hopefully this will decrease her UTIs further.  If necessary we will send to urology  for consult.

## 2021-12-07 NOTE — Progress Notes (Signed)
UTI symptoms, urgency, burning, no hematuria.

## 2021-12-08 ENCOUNTER — Encounter: Payer: Self-pay | Admitting: Obstetrics & Gynecology

## 2021-12-08 LAB — URINE CULTURE
MICRO NUMBER:: 13000475
SPECIMEN QUALITY:: ADEQUATE

## 2022-02-14 ENCOUNTER — Other Ambulatory Visit: Payer: Self-pay | Admitting: Obstetrics & Gynecology

## 2022-02-14 DIAGNOSIS — Z789 Other specified health status: Secondary | ICD-10-CM

## 2022-02-16 ENCOUNTER — Other Ambulatory Visit: Payer: Self-pay | Admitting: *Deleted

## 2022-02-16 ENCOUNTER — Encounter: Payer: Self-pay | Admitting: Obstetrics & Gynecology

## 2022-02-16 DIAGNOSIS — Z789 Other specified health status: Secondary | ICD-10-CM

## 2022-02-16 MED ORDER — NORGESTIM-ETH ESTRAD TRIPHASIC 0.18/0.215/0.25 MG-35 MCG PO TABS: 1.0000 | ORAL_TABLET | Freq: Every day | ORAL | 0 refills | Status: DC

## 2022-02-17 ENCOUNTER — Other Ambulatory Visit: Payer: Self-pay | Admitting: Obstetrics & Gynecology

## 2022-02-17 DIAGNOSIS — Z1231 Encounter for screening mammogram for malignant neoplasm of breast: Secondary | ICD-10-CM

## 2022-03-08 ENCOUNTER — Other Ambulatory Visit: Payer: Self-pay | Admitting: Obstetrics & Gynecology

## 2022-03-08 DIAGNOSIS — Z789 Other specified health status: Secondary | ICD-10-CM

## 2022-03-12 ENCOUNTER — Encounter: Payer: Self-pay | Admitting: Obstetrics and Gynecology

## 2022-03-12 ENCOUNTER — Ambulatory Visit (INDEPENDENT_AMBULATORY_CARE_PROVIDER_SITE_OTHER): Payer: BC Managed Care – PPO | Admitting: Obstetrics and Gynecology

## 2022-03-12 VITALS — BP 144/93 | HR 71 | Ht 69.0 in | Wt 148.0 lb

## 2022-03-12 DIAGNOSIS — Z789 Other specified health status: Secondary | ICD-10-CM | POA: Diagnosis not present

## 2022-03-12 DIAGNOSIS — Z01419 Encounter for gynecological examination (general) (routine) without abnormal findings: Secondary | ICD-10-CM

## 2022-03-12 MED ORDER — NORGESTIM-ETH ESTRAD TRIPHASIC 0.18/0.215/0.25 MG-35 MCG PO TABS: 1.0000 | ORAL_TABLET | Freq: Every day | ORAL | 0 refills | Status: DC

## 2022-03-12 NOTE — Progress Notes (Signed)
Repeat BP 146/87

## 2022-03-12 NOTE — Progress Notes (Signed)
GYNECOLOGY ANNUAL PREVENTATIVE CARE ENCOUNTER NOTE  History:     Shannon Moses is a 41 y.o. G1P1 female here for a routine annual gynecologic exam.  Current complaints: none.   Denies abnormal vaginal bleeding, discharge, pelvic pain, problems with intercourse or other gynecologic concerns.    Gynecologic History Patient's last menstrual period was 03/09/2022. Contraception: OCP (estrogen/progesterone) Last Pap: 2021. Result was normal with negative HPV Last Mammogram: 2022.  Result was normal Last Colonoscopy: nA  Obstetric History OB History  Gravida Para Term Preterm AB Living  1 1       1   SAB IAB Ectopic Multiple Live Births               # Outcome Date GA Lbr Len/2nd Weight Sex Delivery Anes PTL Lv  1 Para             Past Medical History:  Diagnosis Date   Hyperlipidemia    TMJ syndrome     Past Surgical History:  Procedure Laterality Date   BUNIONECTOMY  10/23/12   GYNECOLOGIC CRYOSURGERY  2007   MANDIBLE SURGERY     RHINOPLASTY      Current Outpatient Medications on File Prior to Visit  Medication Sig Dispense Refill   albuterol (VENTOLIN HFA) 108 (90 Base) MCG/ACT inhaler Inhale 2 puffs into the lungs every 6 (six) hours as needed for wheezing or shortness of breath. 8 g 0   Multiple Vitamin (MULTIVITAMIN) tablet Take by mouth.     Norgestimate-Ethinyl Estradiol Triphasic 0.18/0.215/0.25 MG-35 MCG tablet Take 1 tablet by mouth daily. 28 tablet 0   No current facility-administered medications on file prior to visit.    No Known Allergies  Social History:  reports that she has quit smoking. She has never used smokeless tobacco. She reports current alcohol use of about 2.0 standard drinks per week. She reports that she does not use drugs.  Family History  Problem Relation Age of Onset   Heart disease Mother    Sleep apnea Mother    Diabetes Mother     The following portions of the patient's history were reviewed and updated as appropriate:  allergies, current medications, past family history, past medical history, past social history, past surgical history and problem list.  Review of Systems Pertinent items noted in HPI and remainder of comprehensive ROS otherwise negative.  Physical Exam:  BP (!) 144/93   Pulse 71   Ht 5\' 9"  (1.753 m)   Wt 148 lb (67.1 kg)   LMP 03/09/2022   BMI 21.86 kg/m  CONSTITUTIONAL: Well-developed, well-nourished female in no acute distress.  HENT:  Normocephalic, atraumatic, External right and left ear normal.  EYES: Conjunctivae and EOM are normal. Pupils are equal, round, and reactive to light. No scleral icterus.  NECK: Normal range of motion, supple, no masses.  Normal thyroid.  SKIN: Skin is warm and dry. No rash noted. Not diaphoretic. No erythema. No pallor. MUSCULOSKELETAL: Normal range of motion. No tenderness.  No cyanosis, clubbing, or edema. NEUROLOGIC: Alert and oriented to person, place, and time. Normal reflexes, muscle tone coordination.  PSYCHIATRIC: Normal mood and affect. Normal behavior. Normal judgment and thought content. CARDIOVASCULAR: Normal heart rate noted, regular rhythm RESPIRATORY: Clear to auscultation bilaterally. Effort and breath sounds normal, no problems with respiration noted. BREASTS: Symmetric in size. No masses, tenderness, skin changes, nipple drainage, or lymphadenopathy bilaterally. Performed in the presence of a chaperone. ABDOMEN: Soft, no distention noted.  No tenderness, rebound or guarding.  PELVIC: Normal appearing external genitalia and urethral meatus.   Normal uterine size, no other palpable masses, no uterine or adnexal tenderness.  Performed in the presence of a chaperone.   Assessment and Plan:   1. Women's annual routine gynecological examination  - MM Digital Screening; Future - Recoomend PCP -Discussed colonoscopy vs colo guard @ age 52  2. Uses birth control  - Norgestimate-Ethinyl Estradiol Triphasic 0.18/0.215/0.25 MG-35 MCG  tablet; Take 1 tablet by mouth daily.  Dispense: 28 tablet; Refill: 0    Mammogram scheduled Routine preventative health maintenance measures emphasized. Please refer to After Visit Summary for other counseling recommendations.    Shannon Moses, Shannon Moses, Shannon Moses for [employer], [place]

## 2022-04-12 ENCOUNTER — Encounter: Payer: Self-pay | Admitting: Obstetrics & Gynecology

## 2022-04-12 ENCOUNTER — Other Ambulatory Visit: Payer: Self-pay | Admitting: *Deleted

## 2022-04-12 DIAGNOSIS — Z789 Other specified health status: Secondary | ICD-10-CM

## 2022-04-12 MED ORDER — NORGESTIM-ETH ESTRAD TRIPHASIC 0.18/0.215/0.25 MG-35 MCG PO TABS: 1.0000 | ORAL_TABLET | Freq: Every day | ORAL | 2 refills | Status: DC

## 2022-04-15 ENCOUNTER — Ambulatory Visit (INDEPENDENT_AMBULATORY_CARE_PROVIDER_SITE_OTHER): Payer: BC Managed Care – PPO

## 2022-04-15 DIAGNOSIS — Z1231 Encounter for screening mammogram for malignant neoplasm of breast: Secondary | ICD-10-CM | POA: Diagnosis not present

## 2022-05-28 ENCOUNTER — Ambulatory Visit: Payer: BC Managed Care – PPO | Admitting: Family Medicine

## 2022-06-13 NOTE — Progress Notes (Unsigned)
   New Patient Office Visit  Subjective    Patient ID: Shannon Moses, female    DOB: December 15, 1980  Age: 41 y.o. MRN: [id number]  CC: No chief complaint on file.   HPI Shannon Moses presents to establish care ***  Outpatient Encounter Medications as of 06/14/2022  Medication Sig   albuterol (VENTOLIN HFA) 108 (90 Base) MCG/ACT inhaler Inhale 2 puffs into the lungs every 6 (six) hours as needed for wheezing or shortness of breath.   Multiple Vitamin (MULTIVITAMIN) tablet Take by mouth.   Norgestimate-Ethinyl Estradiol Triphasic 0.18/0.215/0.25 MG-35 MCG tablet Take 1 tablet by mouth daily.   No facility-administered encounter medications on file as of 06/14/2022.    Past Medical History:  Diagnosis Date   Hyperlipidemia    TMJ syndrome     Past Surgical History:  Procedure Laterality Date   BUNIONECTOMY  10/23/12   GYNECOLOGIC CRYOSURGERY  2007   MANDIBLE SURGERY     RHINOPLASTY      Family History  Problem Relation Age of Onset   Heart disease Mother    Sleep apnea Mother    Diabetes Mother     Social History   Socioeconomic History   Marital status: Married    Spouse name: Not on file   Number of children: Not on file   Years of education: Not on file   Highest education level: Not on file  Occupational History   Not on file  Tobacco Use   Smoking status: Former   Smokeless tobacco: Never  Vaping Use   Vaping Use: Never used  Substance and Sexual Activity   Alcohol use: Yes    Alcohol/week: 2.0 standard drinks of alcohol    Types: 2 Glasses of wine per week    Comment: Socially    Drug use: No   Sexual activity: Yes    Birth control/protection: None, Pill  Other Topics Concern   Not on file  Social History Narrative   Not on file   Social Determinants of Health   Financial Resource Strain: Not on file  Food Insecurity: Not on file  Transportation Needs: Not on file  Physical Activity: Not on file  Stress: Not on file  Social  Connections: Not on file  Intimate Partner Violence: Not on file    ROS      Objective    There were no vitals taken for this visit.  Physical Exam  {Labs (Optional):23779}    Assessment & Plan:   Problem List Items Addressed This Visit   None   No follow-ups on file.   Charlton Amor, DO

## 2022-06-14 ENCOUNTER — Encounter: Payer: Self-pay | Admitting: Family Medicine

## 2022-06-14 ENCOUNTER — Ambulatory Visit (INDEPENDENT_AMBULATORY_CARE_PROVIDER_SITE_OTHER): Payer: BC Managed Care – PPO | Admitting: Family Medicine

## 2022-06-14 VITALS — BP 122/79 | HR 83 | Ht 69.0 in | Wt 147.0 lb

## 2022-06-14 DIAGNOSIS — Z0000 Encounter for general adult medical examination without abnormal findings: Secondary | ICD-10-CM | POA: Diagnosis not present

## 2022-06-14 DIAGNOSIS — Z8249 Family history of ischemic heart disease and other diseases of the circulatory system: Secondary | ICD-10-CM

## 2022-06-14 NOTE — Assessment & Plan Note (Signed)
-   did discuss with patient that she is at increased risk for blood clots being on birth control pill. Did encourage her to think about non-hormonal IUD and other contraception treatment - discussed things to do that can decrease risk such as taking breaks on long car rides, getting up on long plane rides and wearing compression stockings.

## 2022-06-14 NOTE — Assessment & Plan Note (Signed)
-   pt doing well from health maintenance standpoint - up to date on mammo and pap

## 2022-06-15 ENCOUNTER — Ambulatory Visit: Payer: BC Managed Care – PPO | Admitting: Family Medicine

## 2022-06-15 LAB — CBC
HCT: 36.8 % (ref 35.0–45.0)
Hemoglobin: 12.6 g/dL (ref 11.7–15.5)
MCH: 33.8 pg — ABNORMAL HIGH (ref 27.0–33.0)
MCHC: 34.2 g/dL (ref 32.0–36.0)
MCV: 98.7 fL (ref 80.0–100.0)
MPV: 10 fL (ref 7.5–12.5)
Platelets: 267 10*3/uL (ref 140–400)
RBC: 3.73 10*6/uL — ABNORMAL LOW (ref 3.80–5.10)
RDW: 11.5 % (ref 11.0–15.0)
WBC: 6.8 10*3/uL (ref 3.8–10.8)

## 2022-06-15 LAB — COMPLETE METABOLIC PANEL WITH GFR
AG Ratio: 2.1 (calc) (ref 1.0–2.5)
ALT: 10 U/L (ref 6–29)
AST: 14 U/L (ref 10–30)
Albumin: 4.7 g/dL (ref 3.6–5.1)
Alkaline phosphatase (APISO): 40 U/L (ref 31–125)
BUN: 8 mg/dL (ref 7–25)
CO2: 25 mmol/L (ref 20–32)
Calcium: 9.5 mg/dL (ref 8.6–10.2)
Chloride: 103 mmol/L (ref 98–110)
Creat: 0.64 mg/dL (ref 0.50–0.99)
Globulin: 2.2 g/dL (calc) (ref 1.9–3.7)
Glucose, Bld: 84 mg/dL (ref 65–99)
Potassium: 4.6 mmol/L (ref 3.5–5.3)
Sodium: 138 mmol/L (ref 135–146)
Total Bilirubin: 0.6 mg/dL (ref 0.2–1.2)
Total Protein: 6.9 g/dL (ref 6.1–8.1)
eGFR: 114 mL/min/{1.73_m2} (ref >=60)

## 2022-06-15 LAB — LIPID PANEL
Cholesterol: 189 mg/dL (ref <200)
HDL: 73 mg/dL (ref >=50)
LDL Cholesterol (Calc): 93 mg/dL (calc)
Non-HDL Cholesterol (Calc): 116 mg/dL (calc) (ref <130)
Total CHOL/HDL Ratio: 2.6 (calc) (ref <5.0)
Triglycerides: 131 mg/dL (ref <150)

## 2022-08-31 DIAGNOSIS — Z23 Encounter for immunization: Secondary | ICD-10-CM | POA: Diagnosis not present

## 2022-09-17 IMAGING — MG MM DIGITAL SCREENING BILAT W/ TOMO AND CAD
8 series · 9 of 24 positions shown · non-contrast
Comparison: None.

CLINICAL DATA: Screening.

EXAM:
DIGITAL SCREENING BILATERAL MAMMOGRAM WITH TOMOSYNTHESIS AND CAD
TECHNIQUE: Bilateral screening digital craniocaudal and mediolateral oblique
mammograms were obtained. Bilateral screening digital breast
tomosynthesis was performed. The images were evaluated with
computer-aided detection.

[L CC synth-2D]
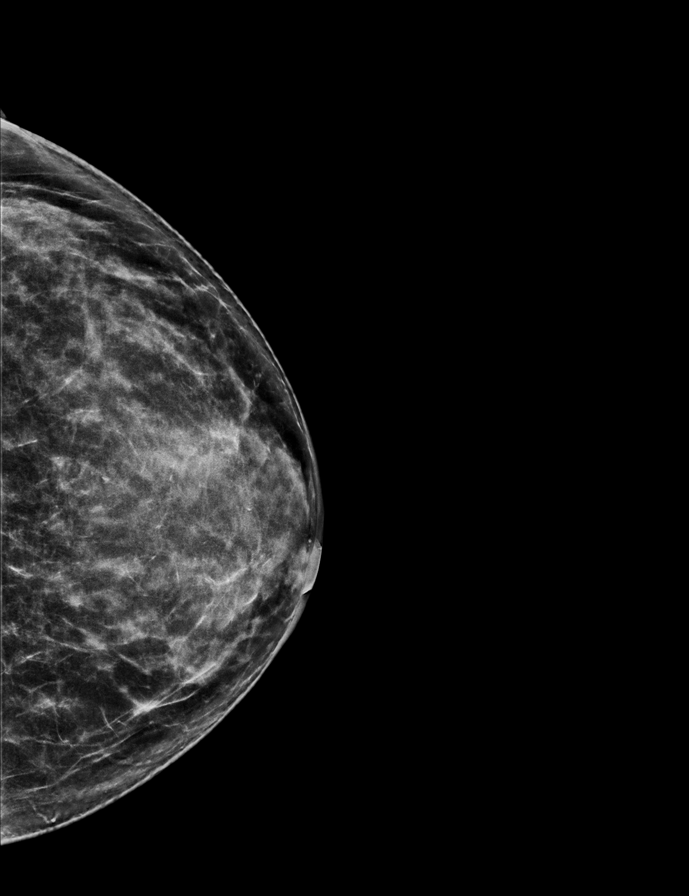

[R CC synth-2D]
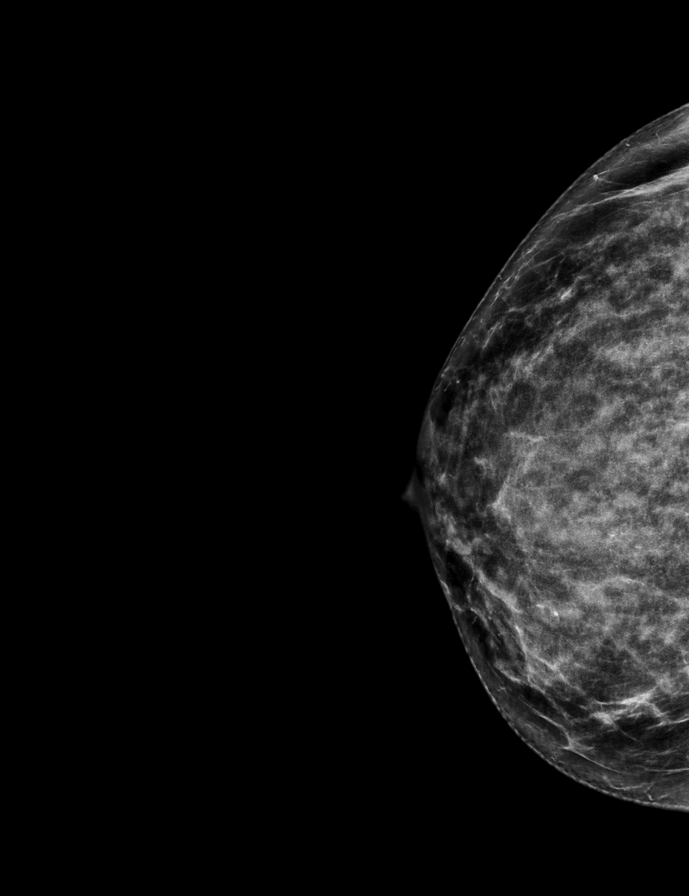

[R MLO synth-2D]
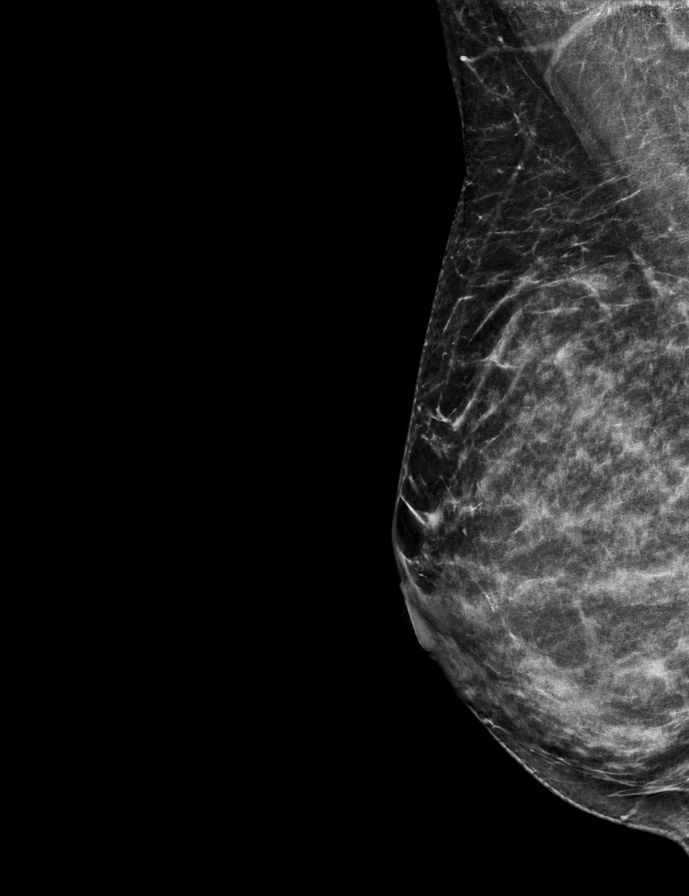

[L MLO synth-2D]
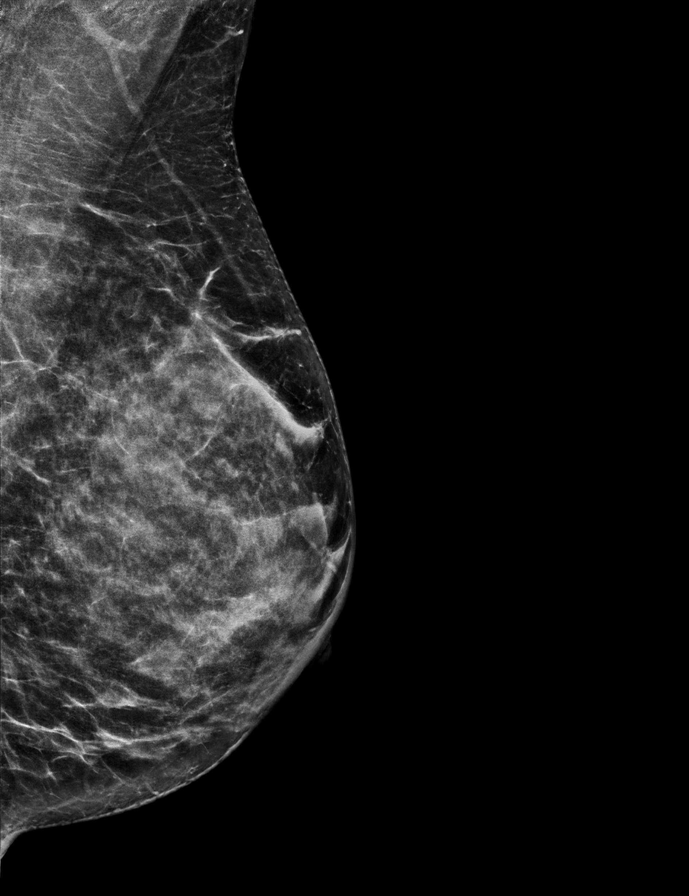

[L CC tomo · 2 of 64 frames shown]
[frame 21/64]
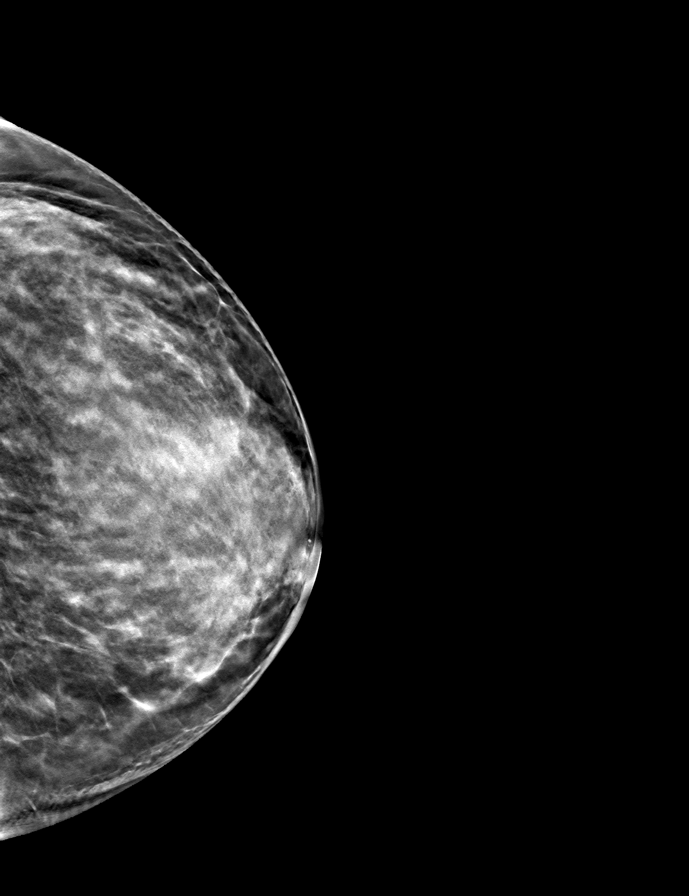
[frame 33/64]
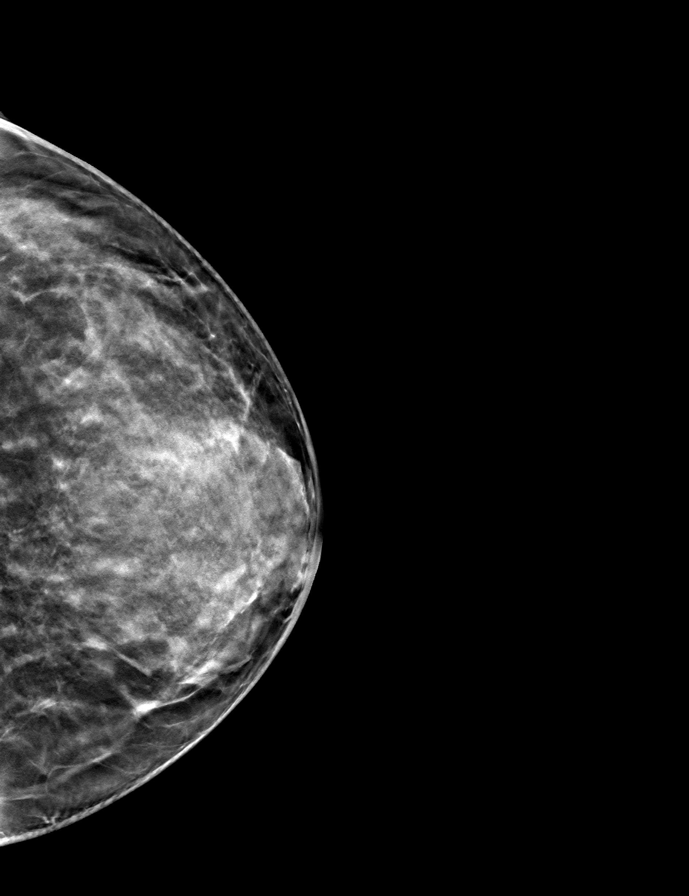

[R CC tomo · tomo slice 31/62.0]
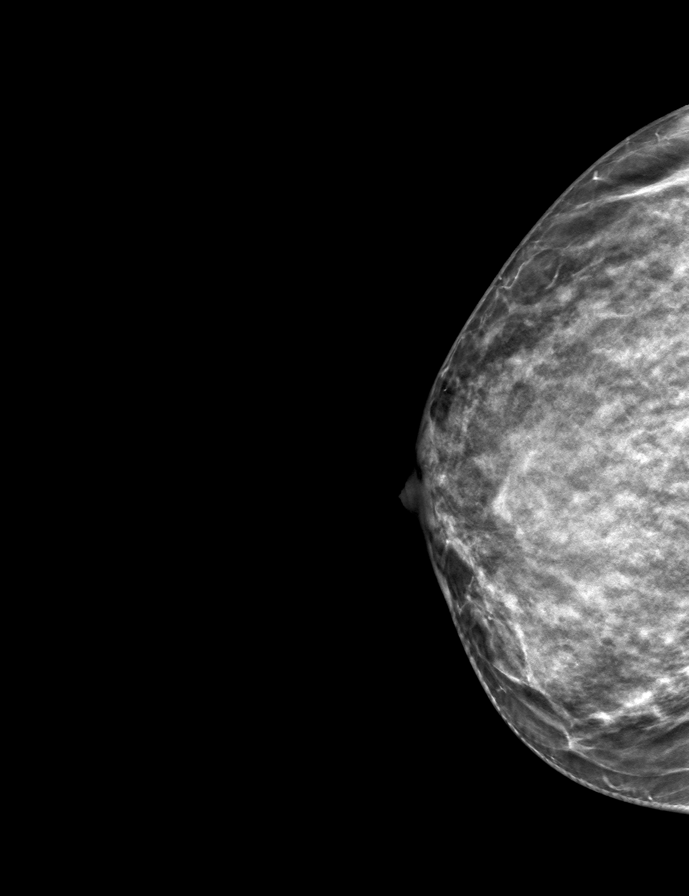

[R MLO tomo · tomo slice 32/63.0]
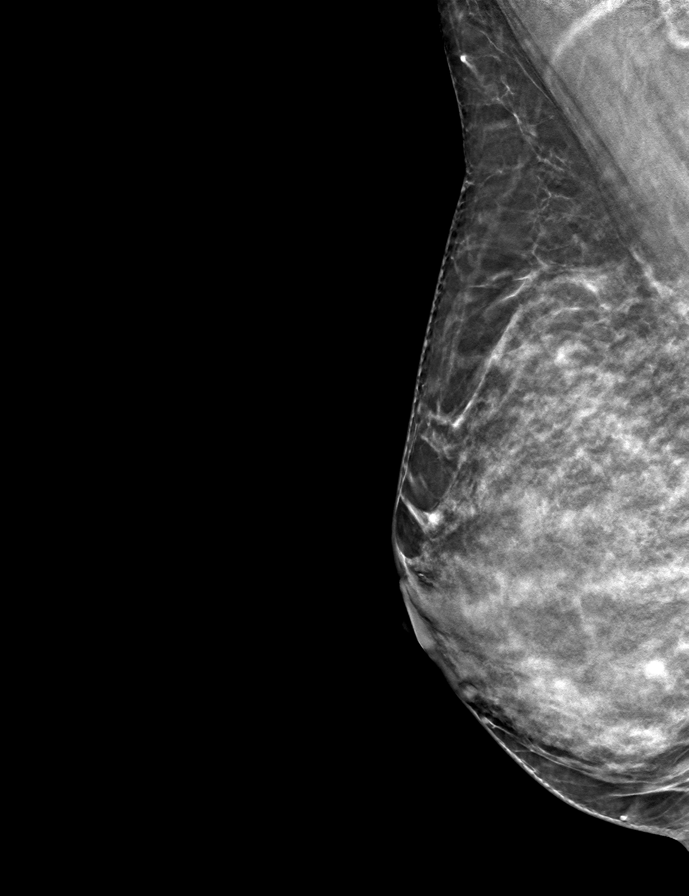

[L MLO tomo · tomo slice 31/61.0]
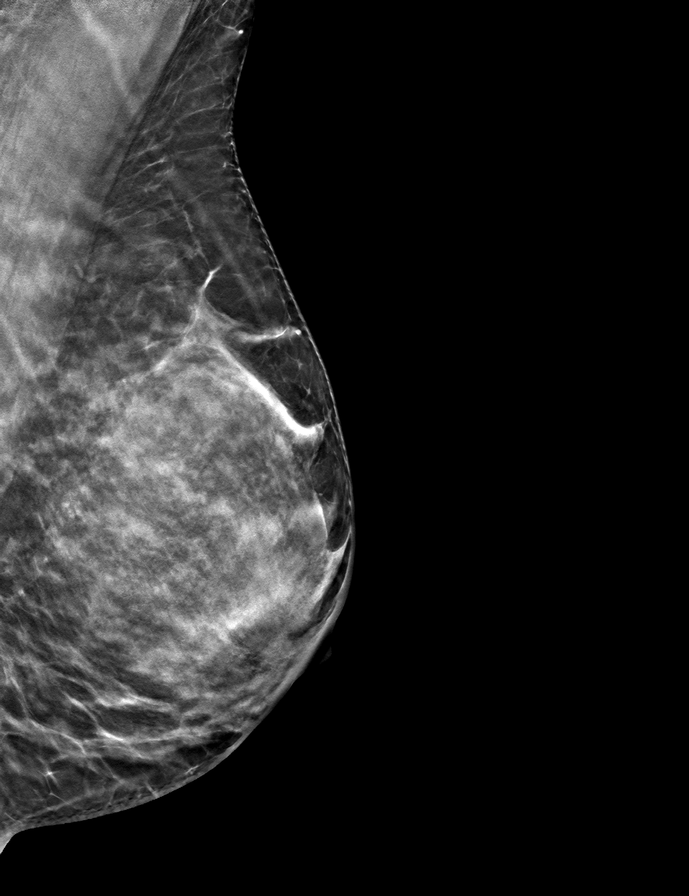

[9 of 24 positions shown; findings below may reference images not displayed]

ACR Breast Density Category d: The breast tissue is extremely dense,
which lowers the sensitivity of mammography.
FINDINGS: There are no findings suspicious for malignancy. The images were
evaluated with computer-aided detection.
IMPRESSION: No mammographic evidence of malignancy. A result letter of this
screening mammogram will be mailed directly to the patient.

RECOMMENDATION:
Screening mammogram in one year. (Code:CA-B-HJO)

BI-RADS CATEGORY  1: Negative.

## 2022-12-12 ENCOUNTER — Other Ambulatory Visit: Payer: Self-pay | Admitting: Obstetrics & Gynecology

## 2022-12-12 DIAGNOSIS — Z789 Other specified health status: Secondary | ICD-10-CM

## 2022-12-16 ENCOUNTER — Other Ambulatory Visit: Payer: Self-pay

## 2022-12-16 DIAGNOSIS — Z789 Other specified health status: Secondary | ICD-10-CM

## 2022-12-16 MED ORDER — NORGESTIM-ETH ESTRAD TRIPHASIC 0.18/0.215/0.25 MG-35 MCG PO TABS: 1.0000 | ORAL_TABLET | Freq: Every day | ORAL | 2 refills | Status: DC

## 2023-02-22 ENCOUNTER — Ambulatory Visit (INDEPENDENT_AMBULATORY_CARE_PROVIDER_SITE_OTHER): Payer: BC Managed Care – PPO | Admitting: Family Medicine

## 2023-02-22 ENCOUNTER — Encounter: Payer: Self-pay | Admitting: Family Medicine

## 2023-02-22 VITALS — BP 133/73 | HR 66 | Ht 69.0 in | Wt 148.2 lb

## 2023-02-22 DIAGNOSIS — J019 Acute sinusitis, unspecified: Secondary | ICD-10-CM

## 2023-02-22 DIAGNOSIS — B9689 Other specified bacterial agents as the cause of diseases classified elsewhere: Secondary | ICD-10-CM | POA: Diagnosis not present

## 2023-02-22 MED ORDER — AMOXICILLIN-POT CLAVULANATE 875-125 MG PO TABS: 1.0000 | ORAL_TABLET | Freq: Two times a day (BID) | ORAL | 0 refills | Status: DC

## 2023-02-22 NOTE — Progress Notes (Signed)
Established patient visit   Patient: Shannon Moses   DOB: 1981/04/30   42 y.o. Female  MRN: [id number] Visit Date: 02/22/2023  Today's healthcare provider: Charlton Amor, DO   Chief Complaint  Patient presents with   possible sinus infection    Patient in office c/o  R sided  facial pressure ( cheek area, behind right eye,  right ear  , upper teeth) patient states when she bent over this morning she felt pressure and light headedness  and nausea when coming up from the bent position.ST  x getting worse daily since Thursday 02/17/23.  And also Left ear pain x  started this morning.     SUBJECTIVE    Chief Complaint  Patient presents with   possible sinus infection    Patient in office c/o  R sided  facial pressure ( cheek area, behind right eye,  right ear  , upper teeth) patient states when she bent over this morning she felt pressure and light headedness  and nausea when coming up from the bent position.ST  x getting worse daily since Thursday 02/17/23.  And also Left ear pain x  started this morning.    HPI  Patient presents with greater than 5-day history of facial pain teeth pain and headache.  She said she was out in the yard and noticed a few days later the symptoms.  She does have a history of seasonal allergies and uses Flonase daily.  However, she is not on a daily allergy medication.  She denies any fever, chills, shortness of breath.  Review of Systems  Constitutional:  Negative for activity change, fatigue and fever.  HENT:  Positive for dental problem, ear pain, sinus pain and sore throat.   Respiratory:  Negative for cough and shortness of breath.   Cardiovascular:  Negative for chest pain.  Gastrointestinal:  Negative for abdominal pain.  Genitourinary:  Negative for difficulty urinating.  Neurological:  Positive for headaches.       Current Meds  Medication Sig   albuterol (VENTOLIN HFA) 108 (90 Base) MCG/ACT inhaler Inhale 2 puffs into the lungs every 6  (six) hours as needed for wheezing or shortness of breath.   amoxicillin-clavulanate (AUGMENTIN) 875-125 MG tablet Take 1 tablet by mouth 2 (two) times daily for 7 days.   Multiple Vitamin (MULTIVITAMIN) tablet Take by mouth.   Norgestimate-Ethinyl Estradiol Triphasic 0.18/0.215/0.25 MG-35 MCG tablet Take 1 tablet by mouth daily.    OBJECTIVE    BP 133/73   Pulse 66   Ht 5\' 9"  (1.753 m)   Wt 148 lb 4 oz (67.2 kg)   SpO2 100%   BMI 21.89 kg/m   Physical Exam Vitals and nursing note reviewed.  Constitutional:      General: She is not in acute distress.    Appearance: Normal appearance.  HENT:     Head: Normocephalic and atraumatic.     Comments: Tenderness to palpation of right maxillary sinus and also near right ear.    Right Ear: Tympanic membrane, ear canal and external ear normal.     Left Ear: Tympanic membrane, ear canal and external ear normal.     Nose: Nose normal.     Mouth/Throat:     Comments: Dental pain on right upper molar Eyes:     Conjunctiva/sclera: Conjunctivae normal.  Cardiovascular:     Rate and Rhythm: Normal rate and regular rhythm.  Pulmonary:     Effort: Pulmonary effort is normal.  Breath sounds: Normal breath sounds.  Neurological:     General: No focal deficit present.     Mental Status: She is alert and oriented to person, place, and time.  Psychiatric:        Mood and Affect: Mood normal.        Behavior: Behavior normal.        Thought Content: Thought content normal.        Judgment: Judgment normal.        ASSESSMENT & PLAN    Problem List Items Addressed This Visit       Respiratory   Acute bacterial sinusitis - Primary    Pleasant 42 year old female presents with headache, sinus pain, ear pain, and facial pressure.  Based on symptoms and physical exam findings this likely sounds like a sinus infection.  Will go ahead and treat with with Augmentin      Relevant Medications   amoxicillin-clavulanate (AUGMENTIN) 875-125 MG  tablet    Return if symptoms worsen or fail to improve.      Meds ordered this encounter  Medications   amoxicillin-clavulanate (AUGMENTIN) 875-125 MG tablet    Sig: Take 1 tablet by mouth 2 (two) times daily for 7 days.    Dispense:  14 tablet    Refill:  0    No orders of the defined types were placed in this encounter.    Charlton Amor, DO  [facility] at [facility] [phone] (phone) [phone] (fax)  [facility]

## 2023-02-22 NOTE — Assessment & Plan Note (Signed)
Pleasant 43 year old female presents with headache, sinus pain, ear pain, and facial pressure.  Based on symptoms and physical exam findings this likely sounds like a sinus infection.  Will go ahead and treat with with Augmentin

## 2023-02-28 ENCOUNTER — Ambulatory Visit (INDEPENDENT_AMBULATORY_CARE_PROVIDER_SITE_OTHER): Payer: BC Managed Care – PPO

## 2023-02-28 ENCOUNTER — Encounter: Payer: Self-pay | Admitting: Physician Assistant

## 2023-02-28 ENCOUNTER — Ambulatory Visit (INDEPENDENT_AMBULATORY_CARE_PROVIDER_SITE_OTHER): Payer: BC Managed Care – PPO | Admitting: Physician Assistant

## 2023-02-28 VITALS — BP 135/87 | HR 70 | Ht 69.0 in | Wt 150.0 lb

## 2023-02-28 DIAGNOSIS — S99921A Unspecified injury of right foot, initial encounter: Secondary | ICD-10-CM | POA: Diagnosis not present

## 2023-02-28 DIAGNOSIS — S92515A Nondisplaced fracture of proximal phalanx of left lesser toe(s), initial encounter for closed fracture: Secondary | ICD-10-CM | POA: Insufficient documentation

## 2023-02-28 DIAGNOSIS — S92514A Nondisplaced fracture of proximal phalanx of right lesser toe(s), initial encounter for closed fracture: Secondary | ICD-10-CM | POA: Diagnosis not present

## 2023-02-28 DIAGNOSIS — M7989 Other specified soft tissue disorders: Secondary | ICD-10-CM | POA: Diagnosis not present

## 2023-02-28 NOTE — Patient Instructions (Addendum)
Toe Fracture A toe fracture is a break in one of the toe bones (phalanges). A toe fracture may be: A crack in the surface of the bone (stress fracture). This often occurs in athletes. A break all the way through the bone (complete fracture). What are the causes? This condition may be caused by: Direct impact, such as from dropping a heavy object on your toe. Stubbing your toe. Twisting or stretching your toe out of place. Overuse or repetitive exercise. What increases the risk? You are more likely to develop this condition if you: Play contact sports. Have a condition that causes the bones to become thin and brittle (osteoporosis). Have a low calcium level. What are the signs or symptoms? The main symptoms of this condition are swelling and pain in the toe. Other symptoms include: Bruising. Stiffness. Numbness. A change in the way the toe looks. Broken bones that poke through the skin. Blood beneath the toenail. How is this diagnosed? This condition is diagnosed with a physical exam. You may also have X-rays. How is this treated? Treatment for this condition depends on the type of fracture and its severity. Treatment may include: Taping the broken toe to a toe that is next to it (buddy taping). This is the most common treatment for fractures in which the bone has not moved out of place (non-displaced fracture). Wearing a shoe that has a wide, rigid sole to protect the toe and to limit its movement. Wearing a walking cast. Having a procedure to move the toe back into place. Surgery. This may be needed if the: Pieces of broken bone are out of place (displaced). Bone breaks through the skin. Physical therapy. This is done to help regain movement and strength in the toe. You may need follow-up X-rays to make sure that the bone is healing well and staying in position. Follow these instructions at home: If you have a shoe: Wear the shoe as told by your health care provider. Remove it  only as told by your health care provider. Loosen the shoe if your toes tingle, become numb, or turn cold and blue. Keep the shoe clean and dry. If you have a cast: Do not put pressure on any part of the cast until it is fully hardened. This may take several hours. Do not stick anything inside the cast to scratch your skin. Doing that increases your risk of infection. Check the skin around the cast every day. Tell your health care provider about any concerns. You may put lotion on dry skin around the edges of the cast. Do not put lotion on the skin underneath the cast. Keep the cast clean and dry. Bathing Do not take baths, swim, or use a hot tub until your health care provider approves. Ask your health care provider if you can take showers. If the shoe or cast is not waterproof: Do not let it get wet. Cover it with a watertight covering when you take a bath or a shower. Activity Do not use the injured foot to support your body weight until your health care provider says that you can. Use crutches as directed. Ask your health care provider: What activities are safe for you during recovery. What activities you need to avoid. Do physical therapy exercises as directed. Driving Do not drive or use heavy machinery while taking pain medicine. Do not drive while wearing a cast on a foot that you use for driving. Managing pain, stiffness, and swelling  If directed, put ice on painful   areas: Put ice in a plastic bag. Place a towel between your skin and the bag. If you have a shoe, remove it as told by your health care provider. If you have a cast, place a towel between your cast and the bag. Leave the ice on for 20 minutes, 2-3 times per day. Raise (elevate) the injured area above the level of your heart while you are sitting or lying down. General instructions If your toe was treated with buddy taping, follow your health care provider's instructions for changing the gauze and tape. Change it  more often if: The gauze and tape get wet. If this happens, dry the space between the toes. The gauze and tape are too tight and cause your toe to become pale or numb. If you were not given a protective shoe, wear sturdy, supportive shoes. Your shoes should not pinch your toes and should not fit tightly against your toes. Do not use any products that contain nicotine or tobacco, such as cigarettes and e-cigarettes. These can delay bone healing. If you need help quitting, ask your health care provider. Take over-the-counter and prescription medicines only as told by your health care provider. Keep all follow-up visits as told by your health care provider. This is important. Contact a health care provider if you have: Pain that gets worse or does not get better with medicine. A fever. A bad smell coming from your cast. Get help right away if you have: Any of the following in your toes or your foot: Numbness that gets worse. Tingling. Coldness. Blue skin. Redness or swelling that gets worse. Pain that suddenly becomes severe. Summary A toe fracture is a break in one of the toe bones (phalanges). Treatment depends on how severe your fracture is and how the pieces of the broken bone line up with each other. Treatment may include buddy taping, wearing a shoe or a cast, or using crutches. Ice and elevate your foot to help lessen the pain and swelling. This information is not intended to replace advice given to you by your health care provider. Make sure you discuss any questions you have with your health care provider. Document Revised: 03/16/2021 Document Reviewed: 03/16/2021 Elsevier Patient Education  2023 Elsevier Inc.  

## 2023-02-28 NOTE — Progress Notes (Signed)
Reviewed with patient in office;

## 2023-02-28 NOTE — Progress Notes (Unsigned)
   Acute Office Visit  Subjective:     Patient ID: Shannon Moses, female    DOB: Sep 22, 1981, 42 y.o.   MRN: [id number]  Chief Complaint  Patient presents with   Foot Injury    HPI Patient is in today for  Water slide Saturday  ROS      Objective:    There were no vitals taken for this visit. BP Readings from Last 3 Encounters:  02/28/23 135/87  02/22/23 133/73  06/14/22 122/79   Wt Readings from Last 3 Encounters:  02/28/23 150 lb (68 kg)  02/22/23 148 lb 4 oz (67.2 kg)  06/14/22 147 lb (66.7 kg)      Physical Exam  No results found for any visits on 02/28/23.      Assessment & Plan:  .Marland KitchenTange was seen today for foot injury.  Diagnoses and all orders for this visit:  Closed nondisplaced fracture of proximal phalanx of lesser toe of left foot, initial encounter  Injury of right foot, initial encounter -     DG Foot Complete Right; Future     No follow-ups on file.  Tandy Gaw, PA-C

## 2023-03-01 ENCOUNTER — Encounter: Payer: Self-pay | Admitting: Physician Assistant

## 2023-03-14 ENCOUNTER — Ambulatory Visit (INDEPENDENT_AMBULATORY_CARE_PROVIDER_SITE_OTHER): Payer: BC Managed Care – PPO

## 2023-03-14 ENCOUNTER — Ambulatory Visit (INDEPENDENT_AMBULATORY_CARE_PROVIDER_SITE_OTHER): Payer: BC Managed Care – PPO | Admitting: Sports Medicine

## 2023-03-14 DIAGNOSIS — S92515D Nondisplaced fracture of proximal phalanx of left lesser toe(s), subsequent encounter for fracture with routine healing: Secondary | ICD-10-CM

## 2023-03-14 DIAGNOSIS — S92514A Nondisplaced fracture of proximal phalanx of right lesser toe(s), initial encounter for closed fracture: Secondary | ICD-10-CM | POA: Diagnosis not present

## 2023-03-14 NOTE — Assessment & Plan Note (Signed)
2-week status post fracture, reduced at home, last x-rays showed good alignment, she has very little tenderness over the fracture, swelling is a lot better, she will do the boot for another week before transitioning into a rigid shoe, if still hurts she will do the boot for an additional week. No need for pain medication. Understands there will be several months of swelling and several months of loss of motion. I would like some updated x-rays today. Return to see me about 2 weeks.

## 2023-03-14 NOTE — Progress Notes (Signed)
    Procedures performed today:    None.  Independent interpretation of notes and tests performed by another provider:   None.  Brief History, Exam, Impression, and Recommendations:    Closed nondisplaced fracture of proximal phalanx of lesser toe of left foot 2-week status post fracture, reduced at home, last x-rays showed good alignment, she has very little tenderness over the fracture, swelling is a lot better, she will do the boot for another week before transitioning into a rigid shoe, if still hurts she will do the boot for an additional week. No need for pain medication. Understands there will be several months of swelling and several months of loss of motion. I would like some updated x-rays today. Return to see me about 2 weeks.    ____________________________________________ Ihor Austin. Benjamin Stain, M.D., ABFM., CAQSM., AME. Primary Care and Sports Medicine [facility]  Adjunct Professor of Family Medicine  [employer] [facility] [employer]  Restaurant manager, fast food

## 2023-04-11 ENCOUNTER — Ambulatory Visit: Payer: BC Managed Care – PPO | Admitting: Obstetrics & Gynecology

## 2023-04-11 ENCOUNTER — Ambulatory Visit (INDEPENDENT_AMBULATORY_CARE_PROVIDER_SITE_OTHER): Payer: BC Managed Care – PPO | Admitting: Sports Medicine

## 2023-04-11 ENCOUNTER — Encounter: Payer: Self-pay | Admitting: Sports Medicine

## 2023-04-11 DIAGNOSIS — S92515D Nondisplaced fracture of proximal phalanx of left lesser toe(s), subsequent encounter for fracture with routine healing: Secondary | ICD-10-CM

## 2023-04-11 NOTE — Progress Notes (Signed)
    Procedures performed today:    None.  Independent interpretation of notes and tests performed by another provider:   None.  Brief History, Exam, Impression, and Recommendations:    Closed nondisplaced fracture of proximal phalanx of lesser toe of left foot This is a very pleasant 42 year old female, she is now about 6 weeks post fracture of her right fifth toe, her husband reduced the angulated fracture himself at home, x-rays did show good alignment, she had a bit of tenderness, now 6 weeks out she is no longer tender over the fracture, she does have expected swelling, I explained that this is likely to persist for several months, she can just come back to see me as needed.    ____________________________________________ Ihor Austin. Benjamin Stain, M.D., ABFM., CAQSM., AME. Primary Care and Sports Medicine [facility]  Adjunct Professor of Family Medicine  [place] [facility] [employer]  Restaurant manager, fast food

## 2023-04-11 NOTE — Assessment & Plan Note (Signed)
This is a very pleasant 42 year old female, she is now about 6 weeks post fracture of her right fifth toe, her husband reduced the angulated fracture himself at home, x-rays did show good alignment, she had a bit of tenderness, now 6 weeks out she is no longer tender over the fracture, she does have expected swelling, I explained that this is likely to persist for several months, she can just come back to see me as needed.

## 2023-04-18 ENCOUNTER — Other Ambulatory Visit: Payer: Self-pay | Admitting: Obstetrics & Gynecology

## 2023-04-18 DIAGNOSIS — Z1231 Encounter for screening mammogram for malignant neoplasm of breast: Secondary | ICD-10-CM

## 2023-04-19 ENCOUNTER — Encounter: Payer: Self-pay | Admitting: Obstetrics and Gynecology

## 2023-04-19 ENCOUNTER — Other Ambulatory Visit (HOSPITAL_COMMUNITY)
Admission: RE | Admit: 2023-04-19 | Discharge: 2023-04-19 | Disposition: A | Discharge status: Home / Self Care | Payer: BC Managed Care – PPO | Source: Ambulatory Visit | Attending: Obstetrics and Gynecology | Admitting: Obstetrics and Gynecology

## 2023-04-19 ENCOUNTER — Ambulatory Visit (INDEPENDENT_AMBULATORY_CARE_PROVIDER_SITE_OTHER): Payer: BC Managed Care – PPO | Admitting: Obstetrics and Gynecology

## 2023-04-19 VITALS — BP 146/87 | HR 79 | Ht 69.0 in | Wt 151.0 lb

## 2023-04-19 DIAGNOSIS — Z01419 Encounter for gynecological examination (general) (routine) without abnormal findings: Secondary | ICD-10-CM

## 2023-04-19 NOTE — Progress Notes (Signed)
Mammogram scheduled for tomorrow.

## 2023-04-19 NOTE — Progress Notes (Signed)
GYNECOLOGY ANNUAL PREVENTATIVE CARE ENCOUNTER NOTE  History:     Shannon Moses is a 42 y.o. G1P1 female here for a routine annual gynecologic exam.  Current complaints: None.   Denies abnormal vaginal bleeding, discharge, pelvic pain, problems with intercourse or other gynecologic concerns.    Gynecologic History Patient's last menstrual period was 04/06/2023. Contraception: OCP (estrogen/progesterone) Last Pap: 2021. Result was normal with negative HPV Last Mammogram: 2023.  Result was normal Last Colonoscopy: Previously counseled   Obstetric History OB History  Gravida Para Term Preterm AB Living  1 1       1   SAB IAB Ectopic Multiple Live Births               # Outcome Date GA Lbr Len/2nd Weight Sex Delivery Anes PTL Lv  1 Para             Past Medical History:  Diagnosis Date   Hyperlipidemia    TMJ syndrome     Past Surgical History:  Procedure Laterality Date   BUNIONECTOMY  10/23/12   GYNECOLOGIC CRYOSURGERY  2007   MANDIBLE SURGERY     RHINOPLASTY      Current Outpatient Medications on File Prior to Visit  Medication Sig Dispense Refill   albuterol (VENTOLIN HFA) 108 (90 Base) MCG/ACT inhaler Inhale 2 puffs into the lungs every 6 (six) hours as needed for wheezing or shortness of breath. 8 g 0   Multiple Vitamin (MULTIVITAMIN) tablet Take by mouth.     Norgestimate-Ethinyl Estradiol Triphasic 0.18/0.215/0.25 MG-35 MCG tablet Take 1 tablet by mouth daily. 84 tablet 2   No current facility-administered medications on file prior to visit.    No Known Allergies  Social History:  reports that she quit smoking about 19 months ago. Her smoking use included cigarettes. She has never used smokeless tobacco. She reports current alcohol use of about 2.0 standard drinks of alcohol per week. She reports that she does not use drugs.  Family History  Problem Relation Age of Onset   Heart disease Mother    Sleep apnea Mother    Diabetes Mother    Hypertension  Mother    Skin cancer Mother    Hypertension Father    Skin cancer Father    Stroke Maternal Grandmother    Clotting disorder Other     The following portions of the patient's history were reviewed and updated as appropriate: allergies, current medications, past family history, past medical history, past social history, past surgical history and problem list.  Review of Systems Pertinent items noted in HPI and remainder of comprehensive ROS otherwise negative.  Physical Exam:  BP (!) 146/87   Pulse 79   Ht 5\' 9"  (1.753 m)   Wt 151 lb (68.5 kg)   LMP 04/06/2023   BMI 22.30 kg/m  CONSTITUTIONAL: Well-developed, well-nourished female in no acute distress.  HENT:  Normocephalic, atraumatic, External right and left ear normal.  EYES: Conjunctivae and EOM are normal. Pupils are equal, round, and reactive to light. No scleral icterus.  NECK: Normal range of motion, supple, no masses.  Normal thyroid.  SKIN: Skin is warm and dry. No rash noted. Not diaphoretic. No erythema. No pallor. MUSCULOSKELETAL: Normal range of motion. No tenderness.  No cyanosis, clubbing, or edema. NEUROLOGIC: Alert and oriented to person, place, and time. Normal reflexes, muscle tone coordination.  PSYCHIATRIC: Normal mood and affect. Normal behavior. Normal judgment and thought content. CARDIOVASCULAR: Normal heart rate noted, regular rhythm RESPIRATORY:  Clear to auscultation bilaterally. Effort and breath sounds normal, no problems with respiration noted. BREASTS: Symmetric in size. No masses, tenderness, skin changes, nipple drainage, or lymphadenopathy bilaterally. Performed in the presence of a chaperone. ABDOMEN: Soft, no distention noted.  No tenderness, rebound or guarding.  PELVIC: Normal appearing external genitalia and urethral meatus; normal appearing vaginal mucosa and cervix.  No abnormal vaginal discharge noted.  Pap smear obtained.  Normal uterine size, no other palpable masses, no uterine or adnexal  tenderness.  Performed in the presence of a chaperone.   Assessment and Plan:   1. Women's annual routine gynecological examination  - Cytology - PAP( [facility]) - CBC - Comp Met (CMET) - Lipid Profile - HgB A1c - TSH  - Has an appointment in August with PCP and plans to review labs results.    Will follow up results of pap smear and manage accordingly. Mammogram scheduled Routine preventative health maintenance measures emphasized. Please refer to After Visit Summary for other counseling recommendations.    Salimah Martinovich, Harolyn Rutherford, NP [facility] [employer], [facility]

## 2023-04-20 ENCOUNTER — Ambulatory Visit (INDEPENDENT_AMBULATORY_CARE_PROVIDER_SITE_OTHER): Payer: BC Managed Care – PPO

## 2023-04-20 DIAGNOSIS — Z1231 Encounter for screening mammogram for malignant neoplasm of breast: Secondary | ICD-10-CM | POA: Diagnosis not present

## 2023-04-22 LAB — CYTOLOGY - PAP
Adequacy: ABSENT
Comment: NEGATIVE
Diagnosis: NEGATIVE
High risk HPV: NEGATIVE

## 2023-05-11 ENCOUNTER — Ambulatory Visit: Payer: BC Managed Care – PPO | Admitting: Family Medicine

## 2023-05-31 ENCOUNTER — Other Ambulatory Visit: Payer: Self-pay | Admitting: Family Medicine

## 2023-05-31 ENCOUNTER — Encounter: Payer: Self-pay | Admitting: Family Medicine

## 2023-05-31 ENCOUNTER — Ambulatory Visit: Payer: 59 | Attending: Family Medicine

## 2023-05-31 ENCOUNTER — Ambulatory Visit (INDEPENDENT_AMBULATORY_CARE_PROVIDER_SITE_OTHER): Payer: 59 | Admitting: Family Medicine

## 2023-05-31 ENCOUNTER — Ambulatory Visit (INDEPENDENT_AMBULATORY_CARE_PROVIDER_SITE_OTHER): Payer: 59

## 2023-05-31 VITALS — BP 148/76 | HR 74 | Resp 18 | Ht 69.0 in | Wt 149.5 lb

## 2023-05-31 DIAGNOSIS — R0789 Other chest pain: Secondary | ICD-10-CM | POA: Insufficient documentation

## 2023-05-31 DIAGNOSIS — I10 Essential (primary) hypertension: Secondary | ICD-10-CM | POA: Insufficient documentation

## 2023-05-31 DIAGNOSIS — R0989 Other specified symptoms and signs involving the circulatory and respiratory systems: Secondary | ICD-10-CM | POA: Diagnosis not present

## 2023-05-31 MED ORDER — LISINOPRIL 10 MG PO TABS: 10.0000 mg | ORAL_TABLET | Freq: Every day | ORAL | 3 refills | Status: DC

## 2023-05-31 NOTE — Progress Notes (Unsigned)
Enrolled for [employer] to mail a ZIO XT long term holter monitor to the patients address on file.   DOD to read. 

## 2023-05-31 NOTE — Assessment & Plan Note (Signed)
Patient's blood pressure elevated on exam today and with her reported history of noticing increase in blood pressure along with family history of hypertension in addition to EKG showing left atrial enlargement I do think it is wise to start her on some blood pressure medicine today.  Will go ahead and do lisinopril 10 mg so we can get some renal protection as well.  Did.discussed with patient the side effect of cough and she will let me know if she has any.

## 2023-05-31 NOTE — Assessment & Plan Note (Signed)
42 year old female presents with concerns of atypical chest pain.  Pain has been waking her up at night and she feels her heart racing.  She does states she is under a significant amount of stress however I do not want to say this is solely stress related even though there is a good chance this is.  However, we will go ahead and do a full cardiac workup including CBC, BMP, lipid, chest x-ray, D-dimer as well as a TSH.  Patient is on birth control and has taken some longer car rides in the past month or so but does get up to stretch.  Due to being on birth control I will go ahead and order a D-dimer to rule out any concern for blood clot. -EKG done in clinic today shows normal sinus rhythm with some left atrial enlargement.  Heart rate was normal at 68.  No QTc or QT prolongation noted on EKG.

## 2023-05-31 NOTE — Progress Notes (Signed)
Acute Office Visit  Subjective:     Patient ID: Shannon Moses, female    DOB: 01/23/1981, 42 y.o.   MRN: [id number]  Chief Complaint  Patient presents with   Chest Pain    [id number]    HPI Patient is in today for chest congestion and sore throat.  She says the congestion resolved yesterday.  However she is telling me today that she has been having chest pain for the past few weeks.  She says the chest pain wakes her up in the middle of the night.  It is located on the left side.  She also notes that there is some tenderness to palpation of one of her intercostal muscles.  During these episodes she feels like her heart is racing.  She says that sometimes she can feel it during the day and it is located under her left breast.  She denies any shortness of breath.  She has tried stretching which has not helped.  She is currently on birth control.  She does have a family history of high blood pressure for which she is not currently on any medication.  However, she has told me that she noticed her blood pressure starting to get higher and higher over the last few months at various doctors visits.  Review of Systems  Constitutional:  Negative for chills and fever.  HENT:  Positive for congestion and sore throat.   Respiratory:  Negative for cough and shortness of breath.   Cardiovascular:  Negative for chest pain.  Neurological:  Negative for headaches.        Objective:    BP (!) 148/76   Pulse 74   Resp 18   Ht 5\' 9"  (1.753 m)   Wt 149 lb 8 oz (67.8 kg)   SpO2 100%   BMI 22.08 kg/m    Physical Exam Vitals and nursing note reviewed.  Constitutional:      General: She is not in acute distress.    Appearance: Normal appearance.  HENT:     Head: Normocephalic and atraumatic.     Right Ear: External ear normal.     Left Ear: External ear normal.     Nose: Nose normal.  Eyes:     Conjunctiva/sclera: Conjunctivae normal.  Cardiovascular:     Rate and Rhythm: Normal rate and  regular rhythm.  Pulmonary:     Effort: Pulmonary effort is normal.     Breath sounds: Normal breath sounds.  Neurological:     General: No focal deficit present.     Mental Status: She is alert and oriented to person, place, and time.  Psychiatric:        Mood and Affect: Mood normal.        Behavior: Behavior normal.        Thought Content: Thought content normal.        Judgment: Judgment normal.     No results found for any visits on 05/31/23.      Assessment & Plan:   Problem List Items Addressed This Visit       Cardiovascular and Mediastinum   Primary hypertension    Patient's blood pressure elevated on exam today and with her reported history of noticing increase in blood pressure along with family history of hypertension in addition to EKG showing left atrial enlargement I do think it is wise to start her on some blood pressure medicine today.  Will go ahead and do lisinopril 10 mg so we  can get some renal protection as well.  Did.discussed with patient the side effect of cough and she will let me know if she has any.      Relevant Medications   lisinopril (ZESTRIL) 10 MG tablet     Other   Atypical chest pain - Primary    42 year old female presents with concerns of atypical chest pain.  Pain has been waking her up at night and she feels her heart racing.  She does states she is under a significant amount of stress however I do not want to say this is solely stress related even though there is a good chance this is.  However, we will go ahead and do a full cardiac workup including CBC, BMP, lipid, chest x-ray, D-dimer as well as a TSH.  Patient is on birth control and has taken some longer car rides in the past month or so but does get up to stretch.  Due to being on birth control I will go ahead and order a D-dimer to rule out any concern for blood clot. -EKG done in clinic today shows normal sinus rhythm with some left atrial enlargement.  Heart rate was normal at 68.   No QTc or QT prolongation noted on EKG.      Relevant Orders   DG Chest 2 View (Completed)   CBC   Lipid panel   Basic Metabolic Panel (BMET)   D-Dimer, Quantitative   TSH + free T4   LONG TERM MONITOR (3-14 DAYS)    Meds ordered this encounter  Medications   lisinopril (ZESTRIL) 10 MG tablet    Sig: Take 1 tablet (10 mg total) by mouth daily.    Dispense:  30 tablet    Refill:  3    Return in about 4 weeks (around 06/28/2023) for Blood pressure check.  Charlton Amor, DO

## 2023-06-02 NOTE — Addendum Note (Signed)
Addended by: Chalmers Cater on: 06/02/2023 03:34 PM   Modules accepted: Orders

## 2023-06-04 DIAGNOSIS — R0789 Other chest pain: Secondary | ICD-10-CM

## 2023-06-04 DIAGNOSIS — R000 Tachycardia, unspecified: Secondary | ICD-10-CM | POA: Diagnosis not present

## 2023-06-16 ENCOUNTER — Encounter: Payer: Self-pay | Admitting: Family Medicine

## 2023-06-16 ENCOUNTER — Ambulatory Visit: Payer: 59 | Admitting: Family Medicine

## 2023-06-16 VITALS — BP 115/64 | HR 67 | Resp 18 | Ht 69.0 in | Wt 150.5 lb

## 2023-06-16 DIAGNOSIS — Z0000 Encounter for general adult medical examination without abnormal findings: Secondary | ICD-10-CM

## 2023-06-16 DIAGNOSIS — I10 Essential (primary) hypertension: Secondary | ICD-10-CM

## 2023-06-16 NOTE — Progress Notes (Signed)
     Established patient visit   Patient: Shannon Moses   DOB: July 08, 1981   42 y.o. Female  MRN: [id number] Visit Date: 06/16/2023  Today's healthcare provider: Charlton Amor, DO   Chief Complaint  Patient presents with   Annual Exam    SUBJECTIVE    Chief Complaint  Patient presents with   Annual Exam   HPI   Diet: vegan Exercise: cycles and walks  Screenings: Mammograms: June 2024  Family hx: HTN: yes DM: no Cancer: no  Social hx: Alcohol: drinks wine occasionally None smoker  Who do you live with: husband and father in Program[employer], systems or apt? house Safe at home: yes   Review of Systems  Constitutional:  Negative for activity change, fatigue and fever.  Respiratory:  Negative for cough and shortness of breath.   Cardiovascular:  Negative for chest pain.  Gastrointestinal:  Negative for abdominal pain.  Genitourinary:  Negative for difficulty urinating.       Current Meds  Medication Sig   lisinopril (ZESTRIL) 10 MG tablet Take 1 tablet (10 mg total) by mouth daily.   Multiple Vitamin (MULTIVITAMIN) tablet Take by mouth.   Norgestimate-Ethinyl Estradiol Triphasic 0.18/0.215/0.25 MG-35 MCG tablet Take 1 tablet by mouth daily.    OBJECTIVE    BP 115/64 (BP Location: Left Arm, Patient Position: Sitting, Cuff Size: Normal)   Pulse 67   Resp 18   Ht 5\' 9"  (1.753 m)   Wt 150 lb 8 oz (68.3 kg)   LMP 05/31/2023   SpO2 100%   BMI 22.22 kg/m   Physical Exam Vitals and nursing note reviewed.  Constitutional:      General: She is not in acute distress.    Appearance: Normal appearance.  HENT:     Head: Normocephalic and atraumatic.     Right Ear: External ear normal.     Left Ear: External ear normal.     Nose: Nose normal.  Eyes:     Conjunctiva/sclera: Conjunctivae normal.  Cardiovascular:     Rate and Rhythm: Normal rate and regular rhythm.  Pulmonary:     Effort: Pulmonary effort is normal.     Breath sounds: Normal breath sounds.   Neurological:     General: No focal deficit present.     Mental Status: She is alert and oriented to person, place, and time.  Psychiatric:        Mood and Affect: Mood normal.        Behavior: Behavior normal.        Thought Content: Thought content normal.        Judgment: Judgment normal.        ASSESSMENT & PLAN    Problem List Items Addressed This Visit       Cardiovascular and Mediastinum   Primary hypertension    Bp low and patient having hypotension. Will stop lisinopril and have her monitor bp at home        Other   Routine adult health maintenance - Primary    Blood work done at last visit, reviewed lab work       No follow-ups on file.      No orders of the defined types were placed in this encounter.   No orders of the defined types were placed in this encounter.    Charlton Amor, DO  [facility] Primary Care & Sports Medicine at [facility] [phone] (phone) [phone] (fax)  [facility]

## 2023-06-16 NOTE — Assessment & Plan Note (Signed)
Blood work done at last visit, reviewed lab work

## 2023-06-16 NOTE — Assessment & Plan Note (Signed)
Bp low and patient having hypotension. Will stop lisinopril and have her monitor bp at home

## 2023-06-16 NOTE — Addendum Note (Signed)
Addended by: Charlton Amor on: 06/16/2023 04:43 PM   Modules accepted: Level of Service

## 2023-08-21 ENCOUNTER — Other Ambulatory Visit: Payer: Self-pay | Admitting: Obstetrics & Gynecology

## 2023-08-21 DIAGNOSIS — Z789 Other specified health status: Secondary | ICD-10-CM

## 2023-08-28 ENCOUNTER — Other Ambulatory Visit: Payer: Self-pay | Admitting: Family Medicine

## 2023-11-04 ENCOUNTER — Encounter: Payer: Self-pay | Admitting: Family Medicine

## 2023-11-04 ENCOUNTER — Ambulatory Visit (INDEPENDENT_AMBULATORY_CARE_PROVIDER_SITE_OTHER): Payer: 59 | Admitting: Family Medicine

## 2023-11-04 VITALS — BP 157/85 | HR 75 | Ht 69.0 in | Wt 154.2 lb

## 2023-11-04 DIAGNOSIS — L299 Pruritus, unspecified: Secondary | ICD-10-CM

## 2023-11-04 DIAGNOSIS — R21 Rash and other nonspecific skin eruption: Secondary | ICD-10-CM | POA: Insufficient documentation

## 2023-11-04 DIAGNOSIS — H9202 Otalgia, left ear: Secondary | ICD-10-CM | POA: Diagnosis not present

## 2023-11-04 MED ORDER — HYDROXYZINE HCL 10 MG PO TABS: 5.0000 mg | ORAL_TABLET | Freq: Every day | ORAL | 0 refills | Status: DC | PRN

## 2023-11-04 MED ORDER — TRIAMCINOLONE ACETONIDE 0.5 % EX CREA
1.0000 | TOPICAL_CREAM | Freq: Two times a day (BID) | CUTANEOUS | 0 refills | Status: AC
Start: 2023-11-04 — End: ?

## 2023-11-04 NOTE — Assessment & Plan Note (Signed)
 Fluid seen on ear which is likely what is causing her otalgia. No tenderness to palpation of pinna and normal TM on exam  - recommend nasal spray and allergy medication to help fluid

## 2023-11-04 NOTE — Assessment & Plan Note (Signed)
 Rash located on left lower extremity that is rough and hyperpigmented. No warmth or redness appreciated. Has been getting bigger per patient and she admits to pruritus. Have gone ahead and given triamcinolone cream and atarax to help with itching.

## 2023-11-04 NOTE — Progress Notes (Signed)
 Acute Office Visit  Subjective:     Patient ID: Shannon Moses, female    DOB: Nov 14, 1980, 43 y.o.   MRN: [id number]  Chief Complaint  Patient presents with   Rash    Pt states she has a rash on left leg, also left ear pain x1wk    HPI Patient is in today for rash on leg. Also has some left ear pain.   Review of Systems  Constitutional:  Negative for chills and fever.  HENT:  Positive for ear pain.   Respiratory:  Negative for cough and shortness of breath.   Cardiovascular:  Negative for chest pain.  Skin:  Positive for rash.  Neurological:  Negative for headaches.  All other systems reviewed and are negative.       Objective:    BP (!) 157/85 (BP Location: Left Arm, Patient Position: Sitting)   Pulse 75   Ht 5' 9 (1.753 m)   Wt 154 lb 4 oz (70 kg)   SpO2 100%   BMI 22.78 kg/m    Physical Exam Vitals and nursing note reviewed.  Constitutional:      General: She is not in acute distress.    Appearance: Normal appearance.  HENT:     Head: Normocephalic and atraumatic.     Right Ear: Tympanic membrane, ear canal and external ear normal.     Left Ear: Tympanic membrane, ear canal and external ear normal.     Ears:     Comments: Some fluid seen on L ear    Nose: Nose normal.  Eyes:     Conjunctiva/sclera: Conjunctivae normal.  Cardiovascular:     Rate and Rhythm: Normal rate and regular rhythm.  Pulmonary:     Effort: Pulmonary effort is normal.     Breath sounds: Normal breath sounds.  Neurological:     General: No focal deficit present.     Mental Status: She is alert and oriented to person, place, and time.  Psychiatric:        Mood and Affect: Mood normal.        Behavior: Behavior normal.        Thought Content: Thought content normal.        Judgment: Judgment normal.     No results found for any visits on 11/04/23.      Assessment & Plan:   Problem List Items Addressed This Visit       Musculoskeletal and Integument   Rash -  Primary   Rash located on left lower extremity that is rough and hyperpigmented. No warmth or redness appreciated. Has been getting bigger per patient and she admits to pruritus. Have gone ahead and given triamcinolone  cream and atarax  to help with itching.       Relevant Medications   triamcinolone  cream (KENALOG ) 0.5 %   Other Relevant Orders   Ambulatory referral to Dermatology     Other   Otalgia of left ear   Fluid seen on ear which is likely what is causing her otalgia. No tenderness to palpation of pinna and normal TM on exam  - recommend nasal spray and allergy medication to help fluid      Other Visit Diagnoses       Pruritus       Relevant Medications   hydrOXYzine  (ATARAX ) 10 MG tablet       Meds ordered this encounter  Medications   triamcinolone  cream (KENALOG ) 0.5 %    Sig: Apply 1 Application  topically 2 (two) times daily. To affected areas.    Dispense:  30 g    Refill:  0   hydrOXYzine  (ATARAX ) 10 MG tablet    Sig: Take 0.5 tablets (5 mg total) by mouth daily as needed for itching.    Dispense:  30 tablet    Refill:  0    No follow-ups on file.  Bernice GORMAN Juneau, DO

## 2023-11-26 ENCOUNTER — Other Ambulatory Visit: Payer: Self-pay | Admitting: Family Medicine

## 2023-11-26 DIAGNOSIS — L299 Pruritus, unspecified: Secondary | ICD-10-CM

## 2023-12-26 ENCOUNTER — Other Ambulatory Visit: Payer: Self-pay | Admitting: *Deleted

## 2023-12-26 DIAGNOSIS — Z789 Other specified health status: Secondary | ICD-10-CM

## 2023-12-26 MED ORDER — NORGESTIM-ETH ESTRAD TRIPHASIC 0.18/0.215/0.25 MG-35 MCG PO TABS: 1.0000 | ORAL_TABLET | Freq: Every day | ORAL | 2 refills | Status: DC

## 2024-01-10 ENCOUNTER — Ambulatory Visit: Admitting: Obstetrics and Gynecology

## 2024-01-10 ENCOUNTER — Other Ambulatory Visit: Payer: Self-pay | Admitting: Obstetrics and Gynecology

## 2024-01-10 ENCOUNTER — Encounter: Payer: Self-pay | Admitting: Obstetrics and Gynecology

## 2024-01-10 VITALS — BP 121/67 | HR 73 | Ht 69.0 in | Wt 154.0 lb

## 2024-01-10 DIAGNOSIS — N644 Mastodynia: Secondary | ICD-10-CM | POA: Diagnosis not present

## 2024-01-10 NOTE — Progress Notes (Signed)
 Pt reports left breast pain and soreness.

## 2024-01-10 NOTE — Progress Notes (Signed)
 GYNECOLOGY ENCOUNTER NOTE  History:     Shannon Moses is a 43 y.o. G1P1 female here for left breast pain x a few months. The pain comes and goes. She feels an area on her left breast that feels lumpy and bumpy.  The area feels sore. She has no redness or fever. She reports trying home things like avoid under wire bras and massage with no improvement.   Gynecologic History Patient's last menstrual period was 01/10/2024.   Obstetric History OB History  Gravida Para Term Preterm AB Living  1 1    1   SAB IAB Ectopic Multiple Live Births          # Outcome Date GA Lbr Len/2nd Weight Sex Type Anes PTL Lv  1 Para             Past Medical History:  Diagnosis Date   Hyperlipidemia    TMJ syndrome     Past Surgical History:  Procedure Laterality Date   BUNIONECTOMY  10/23/2012   GYNECOLOGIC CRYOSURGERY  10/25/2005   MANDIBLE SURGERY     RHINOPLASTY      Current Outpatient Medications on File Prior to Visit  Medication Sig Dispense Refill   hydrOXYzine (ATARAX) 10 MG tablet TAKE 0.5 TABLETS (5 MG TOTAL) BY MOUTH DAILY AS NEEDED FOR ITCHING. 45 tablet 1   Multiple Vitamin (MULTIVITAMIN) tablet Take by mouth.     Norgestimate-Ethinyl Estradiol Triphasic 0.18/0.215/0.25 MG-35 MCG tablet Take 1 tablet by mouth daily. 84 tablet 2   triamcinolone cream (KENALOG) 0.5 % Apply 1 Application topically 2 (two) times daily. To affected areas. 30 g 0   No current facility-administered medications on file prior to visit.    Allergies  Allergen Reactions   Lisinopril Cough    Social History:  reports that she quit smoking about 2 years ago. Her smoking use included cigarettes and e-cigarettes. She has a 10 pack-year smoking history. She has never used smokeless tobacco. She reports current alcohol use of about 2.0 standard drinks of alcohol per week. She reports that she does not use drugs.  Family History  Problem Relation Age of Onset   Heart disease Mother    Sleep apnea  Mother    Diabetes Mother    Hypertension Mother    Skin cancer Mother    Hypertension Father    Skin cancer Father    Varicose Veins Father    Stroke Maternal Grandmother    Diabetes Maternal Grandmother    Hearing loss Maternal Grandmother    Varicose Veins Maternal Grandmother    Clotting disorder Other    Heart disease Maternal Grandfather    Cancer Paternal Grandfather    Diabetes Paternal Grandfather    Cancer Paternal Grandmother    Varicose Veins Paternal Grandmother     The following portions of the patient's history were reviewed and updated as appropriate: allergies, current medications, past family history, past medical history, past social history, past surgical history and problem list.  Review of Systems Pertinent items noted in HPI and remainder of comprehensive ROS otherwise negative.  Physical Exam:  BP 121/67   Pulse 73   Ht 5\' 9"  (1.753 m)   Wt 154 lb (69.9 kg)   LMP 01/10/2024   BMI 22.74 kg/m  CONSTITUTIONAL: Well-developed, well-nourished female in no acute distress.  HENT:  Normocephalic NECK: Normal range of motion, supple, no masses.   NEUROLOGIC: Alert and oriented to person, place, and time.  PSYCHIATRIC: Normal mood and  affect.  CARDIOVASCULAR: Normal heart rate noted, regular rhythm RESPIRATORY: Clear to auscultation bilaterally. Effort and breath sounds normal, no problems with respiration noted. BREASTS: Symmetric in size. Left breast nodule - pea side around 3'oclock with some tenderness. No skin changes, nipple drainage, or lymphadenopathy bilaterally. Performed in the presence of a chaperone. ABDOMEN: Soft, no distention noted.  No tenderness, rebound or guarding.     Assessment and Plan:    1. Breast pain (Primary)  - [place] LIMITED ULTRASOUND INCLUDING AXILLA LEFT BREAST ; Future  - last Mammo 04/2023 with normal findings.  - Initial BP elevated, repeat normal.    Shannon Moses, Shannon Rutherford, NP [facility] [employer], [facility]

## 2024-01-23 ENCOUNTER — Ambulatory Visit
Admission: RE | Admit: 2024-01-23 | Discharge: 2024-01-23 | Disposition: A | Discharge status: Home / Self Care | Source: Ambulatory Visit | Attending: Obstetrics and Gynecology | Admitting: Obstetrics and Gynecology

## 2024-01-23 DIAGNOSIS — N644 Mastodynia: Secondary | ICD-10-CM

## 2024-03-01 ENCOUNTER — Encounter: Payer: Self-pay | Admitting: Family Medicine

## 2024-04-24 ENCOUNTER — Encounter: Payer: Self-pay | Admitting: Obstetrics and Gynecology

## 2024-04-24 ENCOUNTER — Ambulatory Visit: Admitting: Obstetrics and Gynecology

## 2024-04-24 VITALS — BP 180/82 | HR 59 | Ht 69.0 in | Wt 155.0 lb

## 2024-04-24 DIAGNOSIS — Z1331 Encounter for screening for depression: Secondary | ICD-10-CM

## 2024-04-24 DIAGNOSIS — Z789 Other specified health status: Secondary | ICD-10-CM | POA: Diagnosis not present

## 2024-04-24 DIAGNOSIS — Z01419 Encounter for gynecological examination (general) (routine) without abnormal findings: Secondary | ICD-10-CM

## 2024-04-24 MED ORDER — NORGESTIM-ETH ESTRAD TRIPHASIC 0.18/0.215/0.25 MG-35 MCG PO TABS: 1.0000 | ORAL_TABLET | Freq: Every day | ORAL | 2 refills | Status: DC

## 2024-04-24 NOTE — Addendum Note (Signed)
 Addended by: DORITA NEST I on: 04/24/2024 05:23 PM   Modules accepted: Orders

## 2024-04-24 NOTE — Progress Notes (Signed)
 GYNECOLOGY ANNUAL PREVENTATIVE CARE ENCOUNTER NOTE  History:     Shannon Moses is a 43 y.o. G1P1 female here for a routine annual gynecologic exam.  Current complaints: increasing anxiety, worry, insomnia. Interested in working through this. Declines medication at this time, but would be open to therapy.    Denies abnormal vaginal bleeding, discharge, pelvic pain, problems with intercourse or other gynecologic concerns.     Recently diagnosed with urticaria/hives - had negative ANA and full workup with Dermatology. Concerned this could be related to stress.   White Coat syndrome- reports normal BP's at home.   Gynecologic History Patient's last menstrual period was 04/03/2024. Contraception: OCP (estrogen/progesterone) Last Pap: 2024. Results were: normal with negative HPV Last mammogram: 2025- repeat in June 2025 Obstetric History OB History  Gravida Para Term Preterm AB Living  1 1    1   SAB IAB Ectopic Multiple Live Births          # Outcome Date GA Lbr Len/2nd Weight Sex Type Anes PTL Lv  1 Para             Past Medical History:  Diagnosis Date   Hyperlipidemia    TMJ syndrome     Past Surgical History:  Procedure Laterality Date   BUNIONECTOMY  10/23/2012   GYNECOLOGIC CRYOSURGERY  10/25/2005   MANDIBLE SURGERY     RHINOPLASTY      Current Outpatient Medications on File Prior to Visit  Medication Sig Dispense Refill   CLARITIN 10 MG tablet Take 10 mg by mouth 2 (two) times daily.     famotidine (PEPCID) 20 MG tablet Take 20 mg by mouth 2 (two) times daily.     montelukast (SINGULAIR) 10 MG tablet Take 10 mg by mouth every morning.     Multiple Vitamin (MULTIVITAMIN) tablet Take by mouth.     triamcinolone  cream (KENALOG ) 0.5 % Apply 1 Application topically 2 (two) times daily. To affected areas. 30 g 0   hydrOXYzine  (ATARAX ) 10 MG tablet TAKE 0.5 TABLETS (5 MG TOTAL) BY MOUTH DAILY AS NEEDED FOR ITCHING. (Patient not taking: Reported on 04/24/2024) 45  tablet 1   No current facility-administered medications on file prior to visit.    Allergies  Allergen Reactions   Lisinopril  Cough    Social History:  reports that she quit smoking about 2 years ago. Her smoking use included cigarettes and e-cigarettes. She has a 10 pack-year smoking history. She has never used smokeless tobacco. She reports current alcohol use of about 2.0 standard drinks of alcohol per week. She reports that she does not use drugs.  Family History  Problem Relation Age of Onset   Heart disease Mother    Sleep apnea Mother    Diabetes Mother    Hypertension Mother    Skin cancer Mother    Hypertension Father    Skin cancer Father    Varicose Veins Father    Stroke Maternal Grandmother    Diabetes Maternal Grandmother    Hearing loss Maternal Grandmother    Varicose Veins Maternal Grandmother    Clotting disorder Other    Heart disease Maternal Grandfather    Cancer Paternal Grandfather    Diabetes Paternal Grandfather    Cancer Paternal Grandmother    Varicose Veins Paternal Grandmother     The following portions of the patient's history were reviewed and updated as appropriate: allergies, current medications, past family history, past medical history, past social history, past surgical history and problem  list.  Review of Systems Pertinent items noted in HPI and remainder of comprehensive ROS otherwise negative.  Physical Exam:  BP (!) 180/82   Pulse (!) 59   Ht 5' 9 (1.753 m)   Wt 155 lb (70.3 kg)   LMP 04/03/2024   BMI 22.89 kg/m  CONSTITUTIONAL: Well-developed, well-nourished female in no acute distress.  HENT:  Normocephalic, atraumatic, External right and left ear normal. Oropharynx is clear and moist EYES: Conjunctivae and EOM are normal. Pupils are equal, round, and reactive to light. No scleral icterus.  NECK: Normal range of motion, supple, no masses.  Normal thyroid.  SKIN: Skin is warm and dry. No rash noted. Not diaphoretic. No  erythema. No pallor. MUSCULOSKELETAL: Normal range of motion. No tenderness.  No cyanosis, clubbing, or edema.  2+ distal pulses. NEUROLOGIC: Alert and oriented to person, place, and time. Normal reflexes, muscle tone coordination.  PSYCHIATRIC: Normal mood and affect. Normal behavior. Normal judgment and thought content. CARDIOVASCULAR: Normal heart rate noted, regular rhythm RESPIRATORY: Clear to auscultation bilaterally. Effort and breath sounds normal, no problems with respiration noted. BREASTS: Symmetric in size. No masses, tenderness, skin changes, nipple drainage, or lymphadenopathy bilaterally. Performed in the presence of a chaperone. ABDOMEN: Soft, no distention noted.  No tenderness, rebound or guarding.  PELVIC: Deferred    Assessment and Plan:    1. Well woman exam with routine gynecological exam (Primary)  - Would consider rheumatology referral if HIVES worsen.  - MM 3D SCREENING MAMMOGRAM BILATERAL BREAST; Future  2. Uses birth control  - OCP's and HTN increase the risk of stroke/ heart attack/ blood clots. Monitor BP at home closely.  - Norgestimate-Ethinyl Estradiol Triphasic 0.18/0.215/0.25 MG-35 MCG tablet; Take 1 tablet by mouth daily.  Dispense: 84 tablet; Refill: 2     Shannon Moses, Shannon FERNS, NP [facility] for [employer], [facility]

## 2024-05-01 ENCOUNTER — Ambulatory Visit: Payer: Self-pay

## 2024-05-01 NOTE — Telephone Encounter (Signed)
 FYI Only or Action Required?: FYI only for provider.  Patient was last seen in primary care on 11/04/2023 by Bevin Bernice RAMAN, DO.  Called Nurse Triage reporting Hypertension.  Symptoms began today.  Interventions attempted: Nothing.  Symptoms are: gradually worsening.  Triage Disposition: See Physician Within 24 Hours  Patient/caregiver understands and will follow disposition?: Yes     Copied from CRM [id number]. Topic: Clinical - Red Word Triage >> May 01, 2024 12:45 PM Carmell R wrote: Red Word that prompted transfer to Nurse Triage: High bp of 148/103 last night, previously 160/111. Pt was initially contacting us  to schedule an annual physical with Dr. Crain. Pt also mentioned she has chronic hives called [facility]. Needs that added to appt notes if appt is scheduled.   ----------------------------------------------------------------------- From previous Reason for Contact - Scheduling: Patient/patient representative is calling to schedule an appointment. Refer to attachments for appointment information. Reason for Disposition  Systolic BP  >= 180 OR Diastolic >= 110  Answer Assessment - Initial Assessment Questions 1. BLOOD PRESSURE: What is the blood pressure? Did you take at least two measurements 5 minutes apart?     148/103 2. ONSET: When did you take your blood pressure?     yesterday 3. HOW: How did you take your blood pressure? (e.g., automatic home BP monitor, visiting nurse)     Automatic wrist cuff 4. HISTORY: Do you have a history of high blood pressure?     no 5. MEDICINES: Are you taking any medicines for blood pressure? Have you missed any doses recently?     na 6. OTHER SYMPTOMS: Do you have any symptoms? (e.g., blurred vision, chest pain, difficulty breathing, headache, weakness)     Headaches, lightheadedness 7. PREGNANCY: Is there any chance you are pregnant? When was your last menstrual period?     Na  Last Tuesday BP 150/80.  BP  160/111  Protocols used: Blood Pressure - High-A-AH

## 2024-05-02 ENCOUNTER — Ambulatory Visit: Admitting: Family Medicine

## 2024-05-02 ENCOUNTER — Encounter: Payer: Self-pay | Admitting: Urgent Care

## 2024-05-02 ENCOUNTER — Ambulatory Visit (INDEPENDENT_AMBULATORY_CARE_PROVIDER_SITE_OTHER): Admitting: Urgent Care

## 2024-05-02 VITALS — BP 117/73 | HR 81 | Resp 18 | Ht 69.0 in | Wt 154.2 lb

## 2024-05-02 DIAGNOSIS — R0989 Other specified symptoms and signs involving the circulatory and respiratory systems: Secondary | ICD-10-CM

## 2024-05-02 DIAGNOSIS — N951 Menopausal and female climacteric states: Secondary | ICD-10-CM

## 2024-05-02 DIAGNOSIS — G4700 Insomnia, unspecified: Secondary | ICD-10-CM | POA: Diagnosis not present

## 2024-05-02 NOTE — Patient Instructions (Addendum)
 Please try OTC black cohosh, evening primrose oil and vitamin E. These help with menopausal symptoms. OTC icool can also help with hot flashes.  For your insomnia, you can try OTC melatonin 10mg . You can also consider adding L-tryptophan. This is also OTC. Take two 500mg  tabs at night to help promote restful sleep.  Blood pressure today is normal. If it continues to have significant fluctuations, consider getting on a progesterone only birth control.  Please get a new BP cuff as your current one seems a bit inaccurate.  Schedule an annual physical after 06/15/24.

## 2024-05-02 NOTE — Progress Notes (Signed)
 Established Patient Office Visit  Subjective:  Patient ID: Shannon Moses, female    DOB: 1981/03/21  Age: 43 y.o. MRN: [id number]  Chief Complaint  Patient presents with   Hypertension    Pt has an arm wrist BP cuff that has been reading high lately. It was 135/91 today in office with her cuff. Pt also mentioned getting random hives, she is seeing an allergist for it.    Hypertension   Discussed the use of AI scribe software for clinical note transcription with the patient, who gave verbal consent to proceed.  History of Present Illness   The patient is a 43 year old who presents for evaluation of her blood pressure management.  She has experienced recent elevations in her blood pressure, with a reading of 180/82 at her annual gynecology appointment last week and home readings as high as 168/111 using a wrist cuff. She has a history of elevated blood pressure noted at her well visit last year, for which she was prescribed lisinopril . However, she discontinued the medication due to side effects such as feeling woozy and lightheaded. Her home readings at that time were lower, ranging from 119/73 to 138/73.  She is currently on oral birth control and is concerned about its potential impact on her blood pressure, especially given her family history of hypertension and stroke.  She reports symptoms that may be related to perimenopause, including night sweats occurring three times a week, unexplained weight gain, irritability, and difficulty sleeping. She experiences night sweats that wake her up, leaving her feeling cold and wet, and her husband notes that she feels like a 'heater' at night.  She has a history of stress and anxiety, which she acknowledges may contribute to her symptoms. She prefers natural methods for managing her symptoms and has not tried any medications for sleep or anxiety recently.       Patient Active Problem List   Diagnosis Date Noted   Rash 11/04/2023    Otalgia of left ear 11/04/2023   Atypical chest pain 05/31/2023   Primary hypertension 05/31/2023   Closed nondisplaced fracture of proximal phalanx of lesser toe of left foot 02/28/2023   Acute bacterial sinusitis 02/22/2023   Routine adult health maintenance 06/14/2022   Family history of blood clots 06/14/2022   History of ovarian cyst 02/25/2021   Past Medical History:  Diagnosis Date   Hyperlipidemia 2012?   No meds rx'd, HDL elevated made total cholesterol higher   TMJ syndrome    Past Surgical History:  Procedure Laterality Date   BUNIONECTOMY  10/23/2012   GYNECOLOGIC CRYOSURGERY  10/25/2005   MANDIBLE SURGERY     RHINOPLASTY     Social History   Tobacco Use   Smoking status: Former    Current packs/day: 0.00    Average packs/day: 0.5 packs/day for 20.0 years (10.0 ttl pk-yrs)    Types: Cigarettes, E-cigarettes    Quit date: 09/17/2021    Years since quitting: 2.6   Smokeless tobacco: Never  Vaping Use   Vaping status: Never Used  Substance Use Topics   Alcohol use: Yes    Alcohol/week: 2.0 standard drinks of alcohol    Types: 2 Glasses of wine per week    Comment: Socially    Drug use: No      ROS: as noted in HPI  Objective:     BP 117/73 (BP Location: Left Arm, Patient Position: Sitting, Cuff Size: Normal)   Pulse 81   Resp 18  Ht 5' 9 (1.753 m)   Wt 154 lb 4 oz (70 kg)   LMP 04/03/2024   SpO2 100%   BMI 22.78 kg/m  BP Readings from Last 3 Encounters:  05/02/24 117/73  04/24/24 (!) 180/82  01/10/24 121/67   Wt Readings from Last 3 Encounters:  05/02/24 154 lb 4 oz (70 kg)  04/24/24 155 lb (70.3 kg)  01/10/24 154 lb (69.9 kg)      Physical Exam Vitals and nursing note reviewed.  Constitutional:      General: She is not in acute distress.    Appearance: Normal appearance. She is not ill-appearing, toxic-appearing or diaphoretic.  HENT:     Head: Normocephalic and atraumatic.     Mouth/Throat:     Mouth: Mucous membranes are  moist.  Eyes:     General: No scleral icterus.       Right eye: No discharge.        Left eye: No discharge.     Extraocular Movements: Extraocular movements intact.     Pupils: Pupils are equal, round, and reactive to light.  Neck:     Thyroid: No thyroid mass, thyromegaly or thyroid tenderness.  Cardiovascular:     Rate and Rhythm: Normal rate and regular rhythm.     Heart sounds: No murmur heard. Pulmonary:     Effort: Pulmonary effort is normal. No respiratory distress.     Breath sounds: Normal breath sounds. No stridor. No wheezing or rhonchi.  Musculoskeletal:     Cervical back: Normal range of motion and neck supple. No rigidity or tenderness.  Lymphadenopathy:     Cervical: No cervical adenopathy.  Skin:    General: Skin is warm and dry.     Coloration: Skin is not jaundiced.     Findings: No bruising, erythema or rash.  Neurological:     General: No focal deficit present.     Mental Status: She is alert and oriented to person, place, and time.     Gait: Gait normal.  Psychiatric:        Mood and Affect: Mood normal.        Behavior: Behavior normal.      No results found for any visits on 05/02/24.  Last CBC Lab Results  Component Value Date   WBC 3.9 05/31/2023   HGB 12.9 05/31/2023   HCT 37.4 05/31/2023   MCV 95 05/31/2023   MCH 32.9 05/31/2023   RDW 11.8 05/31/2023   PLT 233 05/31/2023   Last metabolic panel Lab Results  Component Value Date   GLUCOSE 81 05/31/2023   NA 140 05/31/2023   K 4.0 05/31/2023   CL 101 05/31/2023   CO2 23 05/31/2023   BUN 7 05/31/2023   CREATININE 0.69 05/31/2023   EGFR 111 05/31/2023   CALCIUM 9.4 05/31/2023   PROT 6.9 06/14/2022   ALBUMIN 4.1 03/28/2015   BILITOT 0.6 06/14/2022   ALKPHOS 36 (L) 03/28/2015   AST 14 06/14/2022   ALT 10 06/14/2022   Last lipids Lab Results  Component Value Date   CHOL 210 (H) 05/31/2023   HDL 81 05/31/2023   LDLCALC 103 (H) 05/31/2023   TRIG 156 (H) 05/31/2023   CHOLHDL  2.6 05/31/2023   Last hemoglobin A1c No results found for: HGBA1C Last thyroid functions Lab Results  Component Value Date   TSH 2.130 05/31/2023   Last vitamin D  No results found for: 25OHVITD2, 25OHVITD3, VD25OH Last vitamin B12 and Folate No results found for: VITAMINB12,  FOLATE    The 10-year ASCVD risk score (Arnett DK, et al., 2019) is: 0.3%  Assessment & Plan:  Labile blood pressure  Perimenopause  Insomnia, unspecified type  Assessment and Plan    Intermittent Hypertension Intermittent hypertension with recent high readings. Current reading normal. Previous lisinopril  caused lightheadedness. Birth control may contribute but not primary cause. Prefers non-pharmacological interventions. - Discontinue wrist blood pressure cuff due to inaccuracy. - Purchase upper arm blood pressure cuff for accurate monitoring. - Consider progesterone-only birth control if hypertension persists.  Perimenopausal Symptoms Experiencing night sweats, irritability, sleep issues, and weight gain. Prefers natural remedies due to medication sensitivity. Discussed non-pharmacological options and venlafaxine as a pharmaceutical option. - Recommend black cohosh, evening primrose oil, and vitamin E. - Consider iCool for cooling effects. - Try melatonin 10 mg for sleep. - Consider L-tryptophan for sleep and stress management.  General Health Maintenance Due for annual well visit. - Schedule annual well visit after June 15, 2024.         Return in about 7 weeks (around 06/18/2024) for Annual Physical.   Benton LITTIE Gave, PA

## 2024-05-03 NOTE — Telephone Encounter (Signed)
 This request has been handled. The patient was seen on 05/02/24 for the following symptoms with the provider. No further action is required.

## 2024-05-08 ENCOUNTER — Other Ambulatory Visit: Payer: Self-pay | Admitting: Obstetrics and Gynecology

## 2024-05-08 MED ORDER — NORETHIN ACE-ETH ESTRAD-FE 1-20 MG-MCG(24) PO TABS: 1.0000 | ORAL_TABLET | Freq: Every day | ORAL | 11 refills | Status: AC

## 2024-06-01 ENCOUNTER — Other Ambulatory Visit: Payer: Self-pay | Admitting: Medical Genetics

## 2024-06-04 ENCOUNTER — Other Ambulatory Visit: Payer: Self-pay | Admitting: Obstetrics and Gynecology

## 2024-06-04 DIAGNOSIS — Z1231 Encounter for screening mammogram for malignant neoplasm of breast: Secondary | ICD-10-CM

## 2024-06-07 ENCOUNTER — Encounter

## 2024-06-07 DIAGNOSIS — Z1231 Encounter for screening mammogram for malignant neoplasm of breast: Secondary | ICD-10-CM

## 2024-06-14 ENCOUNTER — Telehealth: Payer: Self-pay

## 2024-06-14 DIAGNOSIS — Z0000 Encounter for general adult medical examination without abnormal findings: Secondary | ICD-10-CM

## 2024-06-14 NOTE — Addendum Note (Signed)
 Addended by: Dimitrius Steedman on: 06/14/2024 12:59 PM   Modules accepted: Orders

## 2024-06-14 NOTE — Telephone Encounter (Signed)
 Copied from CRM #[id number]. Topic: Clinical - Request for Lab/Test Order >> Jun 14, 2024  8:47 AM Shannon Moses wrote: Reason for CRM: Pt wants to know if she should come in and get lab work done for her annual physical before 06/18/24. Please follow up and confirm. [phone]

## 2024-06-16 LAB — COMPREHENSIVE METABOLIC PANEL WITH GFR
ALT: 14 IU/L (ref 0–32)
AST: 19 IU/L (ref 0–40)
Albumin: 4.4 g/dL (ref 3.9–4.9)
Alkaline Phosphatase: 44 IU/L (ref 44–121)
BUN/Creatinine Ratio: 12 (ref 9–23)
BUN: 9 mg/dL (ref 6–24)
Bilirubin Total: 0.3 mg/dL (ref 0.0–1.2)
CO2: 21 mmol/L (ref 20–29)
Calcium: 9.2 mg/dL (ref 8.7–10.2)
Chloride: 102 mmol/L (ref 96–106)
Creatinine, Ser: 0.74 mg/dL (ref 0.57–1.00)
Globulin, Total: 2.5 g/dL (ref 1.5–4.5)
Glucose: 84 mg/dL (ref 70–99)
Potassium: 4.5 mmol/L (ref 3.5–5.2)
Sodium: 139 mmol/L (ref 134–144)
Total Protein: 6.9 g/dL (ref 6.0–8.5)
eGFR: 103 mL/min/1.73 (ref >59)

## 2024-06-16 LAB — LIPID PANEL
Chol/HDL Ratio: 2.9 ratio (ref 0.0–4.4)
Cholesterol, Total: 203 mg/dL — ABNORMAL HIGH (ref 100–199)
HDL: 71 mg/dL (ref >39)
LDL Chol Calc (NIH): 117 mg/dL — ABNORMAL HIGH (ref 0–99)
Triglycerides: 82 mg/dL (ref 0–149)
VLDL Cholesterol Cal: 15 mg/dL (ref 5–40)

## 2024-06-16 LAB — CBC WITH DIFFERENTIAL/PLATELET
Basophils Absolute: 0 x10E3/uL (ref 0.0–0.2)
Basos: 1 %
EOS (ABSOLUTE): 0.2 x10E3/uL (ref 0.0–0.4)
Eos: 3 %
Hematocrit: 38 % (ref 34.0–46.6)
Hemoglobin: 12.5 g/dL (ref 11.1–15.9)
Immature Grans (Abs): 0 x10E3/uL (ref 0.0–0.1)
Immature Granulocytes: 0 %
Lymphocytes Absolute: 1.9 x10E3/uL (ref 0.7–3.1)
Lymphs: 32 %
MCH: 33.3 pg — ABNORMAL HIGH (ref 26.6–33.0)
MCHC: 32.9 g/dL (ref 31.5–35.7)
MCV: 101 fL — ABNORMAL HIGH (ref 79–97)
Monocytes Absolute: 0.5 x10E3/uL (ref 0.1–0.9)
Monocytes: 9 %
Neutrophils Absolute: 3.3 x10E3/uL (ref 1.4–7.0)
Neutrophils: 55 %
Platelets: 292 x10E3/uL (ref 150–450)
RBC: 3.75 x10E6/uL — ABNORMAL LOW (ref 3.77–5.28)
RDW: 12.2 % (ref 11.7–15.4)
WBC: 6 x10E3/uL (ref 3.4–10.8)

## 2024-06-16 LAB — HEMOGLOBIN A1C
Est. average glucose Bld gHb Est-mCnc: 103 mg/dL
Hgb A1c MFr Bld: 5.2 % (ref 4.8–5.6)

## 2024-06-16 LAB — TSH: TSH: 1.71 u[IU]/mL (ref 0.450–4.500)

## 2024-06-18 ENCOUNTER — Ambulatory Visit (INDEPENDENT_AMBULATORY_CARE_PROVIDER_SITE_OTHER): Admitting: Urgent Care

## 2024-06-18 ENCOUNTER — Other Ambulatory Visit: Payer: Self-pay | Admitting: Medical Genetics

## 2024-06-18 ENCOUNTER — Encounter: Payer: Self-pay | Admitting: Urgent Care

## 2024-06-18 VITALS — BP 117/76 | HR 70 | Resp 17 | Ht 69.0 in | Wt 153.2 lb

## 2024-06-18 DIAGNOSIS — Z0000 Encounter for general adult medical examination without abnormal findings: Secondary | ICD-10-CM | POA: Diagnosis not present

## 2024-06-18 DIAGNOSIS — R718 Other abnormality of red blood cells: Secondary | ICD-10-CM

## 2024-06-18 DIAGNOSIS — N951 Menopausal and female climacteric states: Secondary | ICD-10-CM | POA: Diagnosis not present

## 2024-06-18 DIAGNOSIS — Z006 Encounter for examination for normal comparison and control in clinical research program: Secondary | ICD-10-CM

## 2024-06-18 NOTE — Patient Instructions (Signed)
 Annual wellness exam completed. Return annually, sooner as needed.

## 2024-06-18 NOTE — Progress Notes (Signed)
 Complete physical exam  Patient: Shannon Moses   DOB: 06/28/1981   43 y.o. Female  MRN: [id number]  Subjective:    Chief Complaint  Patient presents with   Annual Exam    Shannon Moses is a 43 y.o. female who presents today for a complete physical exam. She reports consuming a general diet. Home exercise routine includes home workout videos. She generally feels well. She reports sleeping fairly well. She does not have additional problems to discuss today.   Discussed the use of AI scribe software for clinical note transcription with the patient, who gave verbal consent to proceed.  History of Present Illness   Shannon Moses is a 43 year old female who presents for an annual physical exam.  She has a history of hyperlipidemia with total cholesterol slightly over 200 since her twenties. Recent lab results show an LDL level 17 points above the ideal and an HDL level 21 points above the goal. She does not have diabetes, does not smoke, and is not on blood pressure medication.  She has chronic spontaneous urticaria and takes famotidine daily to manage her hives. She recently started reintroducing meat into her diet after following a whole foods, plant-based diet for two and a half years.  She consumes a glass of red wine every night, which she finds helpful for her mood. Her liver function tests are normal.  She experiences sleep disturbances, often waking up during the night, which she attributes to her husband's movements. She falls asleep easily and uses a sublingual spray with melatonin, lavender, and chamomile to aid her sleep. She also takes Evening Primrose and black cohosh for perimenopausal symptoms, which has helped alleviate night sweats.  Her physical activity includes 30 to 45-minute workouts three to four times a week and walking with her mother. She typically sleeps from 10:00-10:30 PM to 6:30 AM.  She follows with a dentist and dermatologist regularly, but not  with an eye doctor. She reports normal bowel movements.       Most recent fall risk assessment:    05/31/2023    9:08 AM  Fall Risk   Falls in the past year? 0  Number falls in past yr: 0  Injury with Fall? 0  Risk for fall due to : No Fall Risks  Follow up Falls evaluation completed     Most recent depression screenings:    05/02/2024    8:38 AM 04/24/2024    4:13 PM  PHQ 2/9 Scores  PHQ - 2 Score 0 0  PHQ- 9 Score 2 2    Vision:Not within last year  and Dental: No current dental problems and Receives regular dental care  Patient Active Problem List   Diagnosis Date Noted   Rash 11/04/2023   Otalgia of left ear 11/04/2023   Atypical chest pain 05/31/2023   Primary hypertension 05/31/2023   Closed nondisplaced fracture of proximal phalanx of lesser toe of left foot 02/28/2023   Acute bacterial sinusitis 02/22/2023   Routine adult health maintenance 06/14/2022   Family history of blood clots 06/14/2022   History of ovarian cyst 02/25/2021   Past Medical History:  Diagnosis Date   Hyperlipidemia 2012?   No meds rx'd, HDL elevated made total cholesterol higher   TMJ syndrome    Past Surgical History:  Procedure Laterality Date   BUNIONECTOMY  10/23/2012   GYNECOLOGIC CRYOSURGERY  10/25/2005   MANDIBLE SURGERY     RHINOPLASTY     Social History  Tobacco Use   Smoking status: Former    Current packs/day: 0.00    Average packs/day: 0.5 packs/day for 20.0 years (10.0 ttl pk-yrs)    Types: Cigarettes, E-cigarettes    Quit date: 09/17/2021    Years since quitting: 2.7   Smokeless tobacco: Never  Vaping Use   Vaping status: Never Used  Substance Use Topics   Alcohol use: Yes    Alcohol/week: 2.0 standard drinks of alcohol    Types: 2 Glasses of wine per week    Comment: Socially    Drug use: No      Patient Care Team: Lowella Benton [place], [place] as PCP - General (Physician Assistant)   Outpatient Medications Prior to Visit  Medication Sig   CLARITIN 10 MG  tablet Take 10 mg by mouth 2 (two) times daily.   famotidine (PEPCID) 20 MG tablet Take 20 mg by mouth 2 (two) times daily.   hydrOXYzine  (ATARAX ) 10 MG tablet TAKE 0.5 TABLETS (5 MG TOTAL) BY MOUTH DAILY AS NEEDED FOR ITCHING. (Patient not taking: Reported on 06/18/2024)   montelukast (SINGULAIR) 10 MG tablet Take 10 mg by mouth every morning.   Multiple Vitamin (MULTIVITAMIN) tablet Take by mouth.   Norethindrone Acetate-Ethinyl Estrad-FE (LOESTRIN 24 FE) 1-20 MG-MCG(24) tablet Take 1 tablet by mouth daily.   triamcinolone  cream (KENALOG ) 0.5 % Apply 1 Application topically 2 (two) times daily. To affected areas.   No facility-administered medications prior to visit.    ROS Complete 12 point ROS performed with all pertinent positives listed in HPI      Objective:     BP 117/76   Pulse 70   Resp 17   Ht 5' 9 (1.753 m)   Wt 153 lb 4 oz (69.5 kg)   SpO2 99%   BMI 22.63 kg/m  BP Readings from Last 3 Encounters:  06/18/24 117/76  05/02/24 117/73  04/24/24 (!) 180/82   Wt Readings from Last 3 Encounters:  06/18/24 153 lb 4 oz (69.5 kg)  05/02/24 154 lb 4 oz (70 kg)  04/24/24 155 lb (70.3 kg)      Physical Exam Vitals and nursing note reviewed.  Constitutional:      General: She is not in acute distress.    Appearance: Normal appearance. She is not ill-appearing, toxic-appearing or diaphoretic.  HENT:     Head: Normocephalic and atraumatic.     Right Ear: Tympanic membrane, ear canal and external ear normal. There is no impacted cerumen.     Left Ear: Tympanic membrane, ear canal and external ear normal. There is no impacted cerumen.     Nose: Nose normal.     Mouth/Throat:     Mouth: Mucous membranes are moist.     Pharynx: Oropharynx is clear. No oropharyngeal exudate or posterior oropharyngeal erythema.  Eyes:     General: No scleral icterus.       Right eye: No discharge.        Left eye: No discharge.     Extraocular Movements: Extraocular movements intact.      Pupils: Pupils are equal, round, and reactive to light.  Neck:     Thyroid: No thyroid mass, thyromegaly or thyroid tenderness.  Cardiovascular:     Rate and Rhythm: Normal rate and regular rhythm.     Pulses: Normal pulses.     Heart sounds: No murmur heard. Pulmonary:     Effort: Pulmonary effort is normal. No respiratory distress.     Breath sounds: Normal breath  sounds. No stridor. No wheezing or rhonchi.  Abdominal:     General: Abdomen is flat. Bowel sounds are normal. There is no distension.     Palpations: Abdomen is soft. There is no mass.     Tenderness: There is no abdominal tenderness. There is no guarding.  Musculoskeletal:     Cervical back: Normal range of motion and neck supple. No rigidity or tenderness.     Right lower leg: No edema.     Left lower leg: No edema.  Lymphadenopathy:     Cervical: No cervical adenopathy.  Skin:    General: Skin is warm and dry.     Coloration: Skin is not jaundiced.     Findings: No bruising, erythema or rash.  Neurological:     General: No focal deficit present.     Mental Status: She is alert and oriented to person, place, and time.     Sensory: No sensory deficit.     Motor: No weakness.  Psychiatric:        Mood and Affect: Mood normal.        Behavior: Behavior normal.      No results found for any visits on 06/18/24. Last CBC Lab Results  Component Value Date   WBC 6.0 06/15/2024   HGB 12.5 06/15/2024   HCT 38.0 06/15/2024   MCV 101 (H) 06/15/2024   MCH 33.3 (H) 06/15/2024   RDW 12.2 06/15/2024   PLT 292 06/15/2024   Last metabolic panel Lab Results  Component Value Date   GLUCOSE 84 06/15/2024   NA 139 06/15/2024   K 4.5 06/15/2024   CL 102 06/15/2024   CO2 21 06/15/2024   BUN 9 06/15/2024   CREATININE 0.74 06/15/2024   EGFR 103 06/15/2024   CALCIUM 9.2 06/15/2024   PROT 6.9 06/15/2024   ALBUMIN 4.4 06/15/2024   LABGLOB 2.5 06/15/2024   BILITOT 0.3 06/15/2024   ALKPHOS 44 06/15/2024   AST 19  06/15/2024   ALT 14 06/15/2024   Last lipids Lab Results  Component Value Date   CHOL 203 (H) 06/15/2024   HDL 71 06/15/2024   LDLCALC 117 (H) 06/15/2024   TRIG 82 06/15/2024   CHOLHDL 2.9 06/15/2024   Last hemoglobin A1c Lab Results  Component Value Date   HGBA1C 5.2 06/15/2024   Last thyroid functions Lab Results  Component Value Date   TSH 1.710 06/15/2024   Last vitamin D  No results found for: 25OHVITD2, 25OHVITD3, VD25OH Last vitamin B12 and Folate No results found for: VITAMINB12, FOLATE      Assessment & Plan:    Routine Health Maintenance and Physical Exam  Immunization History  Administered Date(s) Administered   Influenza,inj,Quad PF,6+ Mos 06/10/2017, 07/15/2018, 08/31/2022   Influenza-Unspecified 11/09/2018    Health Maintenance  Topic Date Due   COVID-19 Vaccine (1 - 2024-25 season) Never done   INFLUENZA VACCINE  05/25/2024   Hepatitis B Vaccines 19-59 Average Risk (1 of 3 - 19+ 3-dose series) 06/18/2025 (Originally 10/16/1999)   HPV VACCINES (1 - 3-dose SCDM series) 06/18/2025 (Originally 10/16/2007)   Cervical Cancer Screening (HPV/Pap Cotest)  04/18/2028   Hepatitis C Screening  Completed   HIV Screening  Completed   Pneumococcal Vaccine  Aged Out   Meningococcal B Vaccine  Aged Out   DTaP/Tdap/Td  Discontinued    Discussed health benefits of physical activity, and encouraged her to engage in regular exercise appropriate for her age and condition.  Problem List Items Addressed This Visit  Routine adult health maintenance - Primary   Other Visit Diagnoses       Elevated MCV         Perimenopause          Assessment and Plan    Adult Wellness Visit Cholesterol levels slightly elevated, ASCVD risk score 0.4%, below treatment threshold. No cholesterol treatment needed. Emphasized overall risk factors over cholesterol numbers.  Macrocytosis (elevated MCV/MCH) Elevated MCV and MCH possibly due to B12 deficiency or daily  alcohol use. Famotidine may decrease B12 absorption. Daily wine consumption noted, normal liver function tests. Recent reintroduction of meat may affect B12 levels. - Order B12 level to assess for deficiency. - If B12 is low, initiate sublingual B12 supplementation.  Chronic spontaneous urticaria Managed with famotidine, effective in controlling hives. - Continue famotidine for urticaria management.  Perimenopausal symptoms Symptoms well-managed with Evening Primrose and black cohosh supplements. No night sweats since starting supplements. - Continue Evening Primrose and black cohosh supplements.  Insomnia, sleep disturbance Sleep disturbance due to bed-sharing with husband. Falling asleep is not problematic, but frequent awakenings occur. Melatonin and lavender chamomile spray aid sleep. Discussed potential benefits of sleeping separately. - Consider trial of sleeping in separate rooms to improve sleep quality.        No follow-ups on file.     Benton LITTIE Gave, PA

## 2024-06-26 ENCOUNTER — Encounter: Payer: Self-pay | Admitting: Sports Medicine

## 2024-07-12 ENCOUNTER — Ambulatory Visit

## 2024-07-12 DIAGNOSIS — Z1231 Encounter for screening mammogram for malignant neoplasm of breast: Secondary | ICD-10-CM

## 2024-07-16 ENCOUNTER — Ambulatory Visit: Payer: Self-pay | Admitting: Family Medicine

## 2024-07-16 NOTE — Progress Notes (Signed)
 Please call patient. Normal mammogram.  Repeat in 1 year.

## 2024-08-06 LAB — GENECONNECT MOLECULAR SCREEN: Genetic Analysis Overall Interpretation: NEGATIVE
